# Patient Record
Sex: Female | Born: 1975 | Hispanic: Yes | Marital: Single | Smoking: Never smoker
Health system: Southern US, Community
[De-identification: ages and names within clinical notes are randomized; demographics above are authoritative.]

## PROBLEM LIST (undated history)

## (undated) DIAGNOSIS — A6000 Herpesviral infection of urogenital system, unspecified: Secondary | ICD-10-CM

## (undated) DIAGNOSIS — L309 Dermatitis, unspecified: Secondary | ICD-10-CM

## (undated) DIAGNOSIS — Z332 Encounter for elective termination of pregnancy: Secondary | ICD-10-CM

## (undated) DIAGNOSIS — E119 Type 2 diabetes mellitus without complications: Secondary | ICD-10-CM

## (undated) DIAGNOSIS — L80 Vitiligo: Secondary | ICD-10-CM

## (undated) DIAGNOSIS — M1990 Unspecified osteoarthritis, unspecified site: Secondary | ICD-10-CM

## (undated) DIAGNOSIS — K37 Unspecified appendicitis: Secondary | ICD-10-CM

## (undated) HISTORY — DX: Unspecified osteoarthritis, unspecified site: M19.90

## (undated) HISTORY — DX: Type 2 diabetes mellitus without complications: E11.9

## (undated) HISTORY — PX: APPENDECTOMY: SHX54

## (undated) HISTORY — DX: Vitiligo: L80

## (undated) HISTORY — DX: Dermatitis, unspecified: L30.9

## (undated) HISTORY — DX: Herpesviral infection of urogenital system, unspecified: A60.00

## (undated) HISTORY — DX: Unspecified appendicitis: K37

## (undated) HISTORY — PX: CHOLECYSTECTOMY: SHX55

---

## 2000-03-04 DIAGNOSIS — K37 Unspecified appendicitis: Secondary | ICD-10-CM

## 2000-03-04 HISTORY — DX: Unspecified appendicitis: K37

## 2000-12-14 ENCOUNTER — Encounter (INDEPENDENT_AMBULATORY_CARE_PROVIDER_SITE_OTHER): Payer: Self-pay | Admitting: *Deleted

## 2000-12-15 ENCOUNTER — Inpatient Hospital Stay (HOSPITAL_COMMUNITY): Admission: EM | Admit: 2000-12-15 | Discharge: 2000-12-23 | Payer: Self-pay | Admitting: Emergency Medicine

## 2000-12-15 ENCOUNTER — Encounter: Payer: Self-pay | Admitting: Emergency Medicine

## 2000-12-29 ENCOUNTER — Ambulatory Visit (HOSPITAL_COMMUNITY): Admission: RE | Admit: 2000-12-29 | Discharge: 2000-12-29 | Payer: Self-pay | Admitting: *Deleted

## 2001-01-08 ENCOUNTER — Ambulatory Visit (HOSPITAL_COMMUNITY): Admission: RE | Admit: 2001-01-08 | Discharge: 2001-01-08 | Payer: Self-pay | Admitting: General Surgery

## 2001-01-08 ENCOUNTER — Encounter: Payer: Self-pay | Admitting: General Surgery

## 2006-09-09 ENCOUNTER — Other Ambulatory Visit: Payer: Self-pay

## 2006-09-09 ENCOUNTER — Emergency Department: Payer: Self-pay | Admitting: Emergency Medicine

## 2008-01-08 IMAGING — US ABDOMEN ULTRASOUND
1 series · 17 of 25 positions shown · non-contrast
Comparison: none

REASON FOR EXAM: RUQ pain
COMMENTS:

PROCEDURE:     US  - US ABDOMEN GENERAL SURVEY  - September 09, 2006  [DATE]
RESULT:     Comparison: No available comparison exam.

[Series 1: abdomen ultrasound · 17 of 48 slices shown]
[im 1/48]
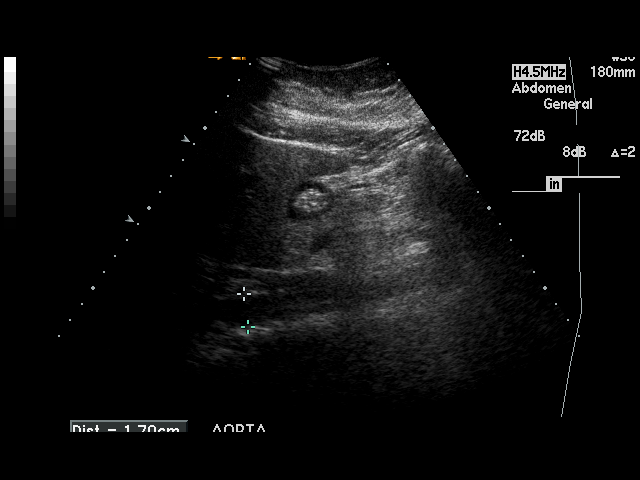
[im 4/48]
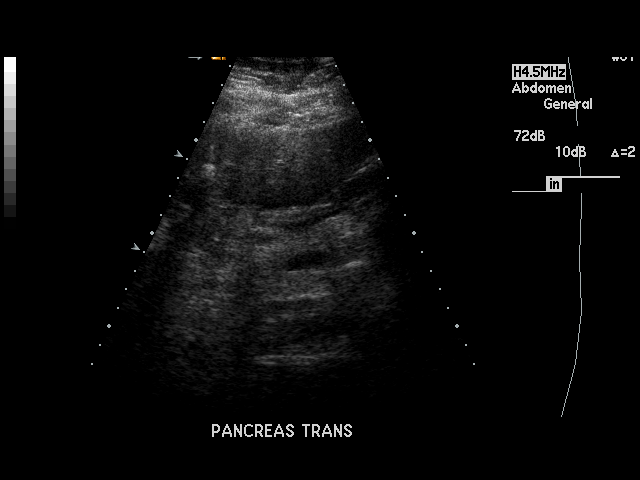
[im 6/48]
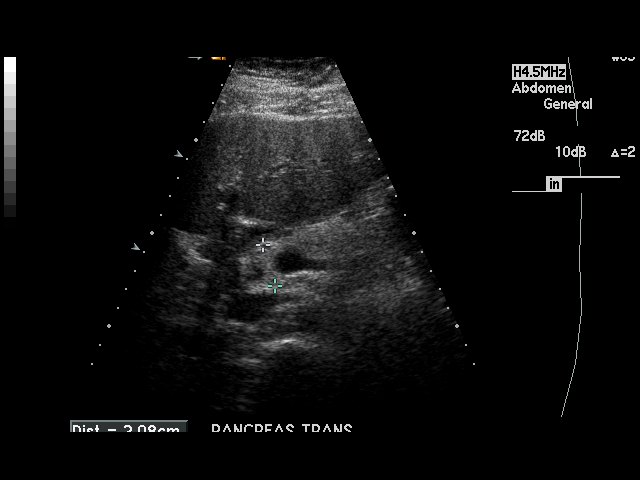
[im 10/48]
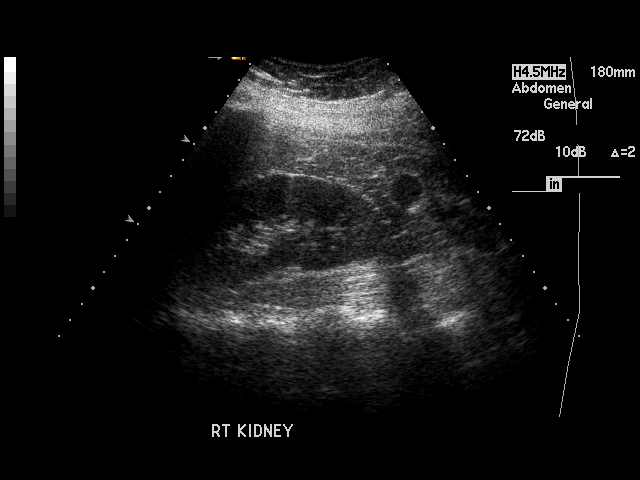
[im 12/48]
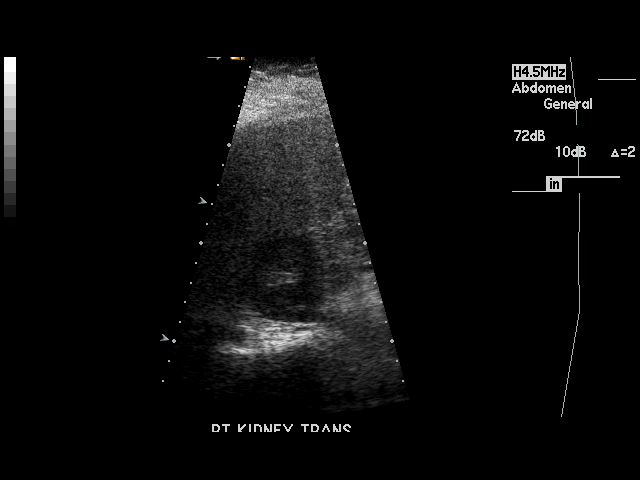
[im 16/48]
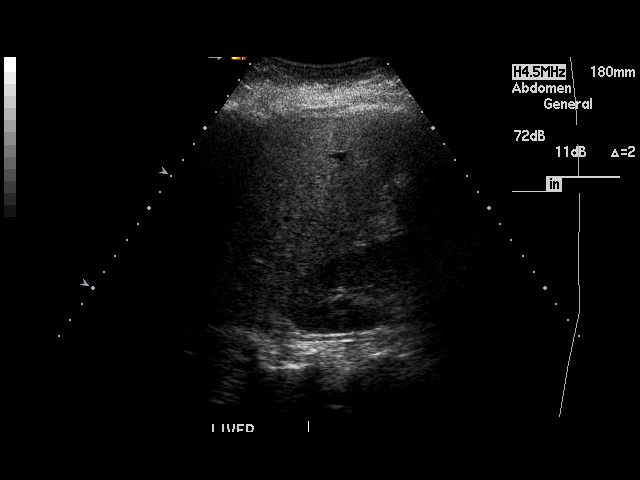
[im 18/48]
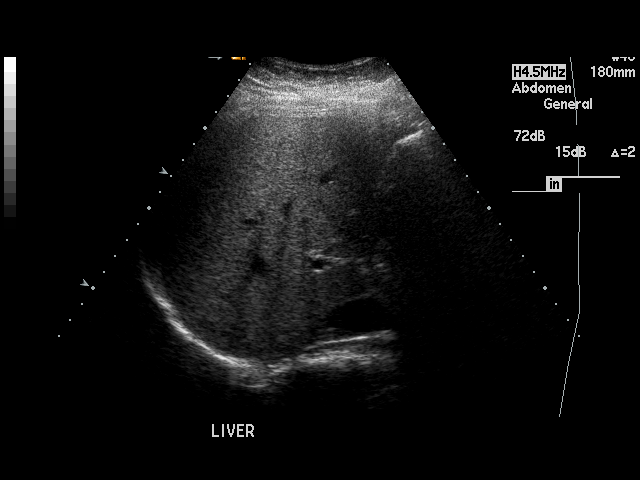
[im 22/48]
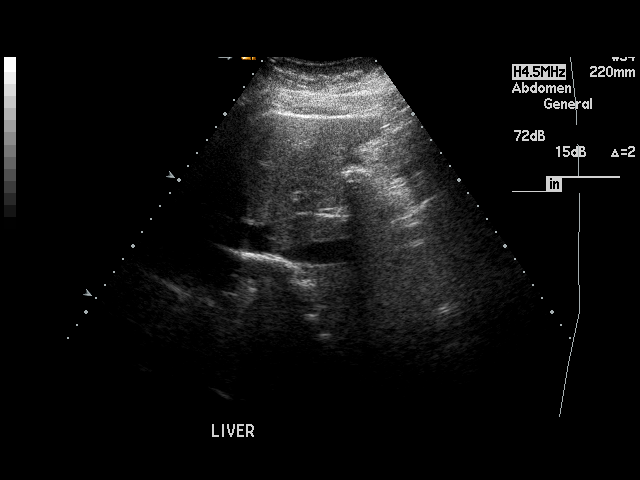
[im 24/48]
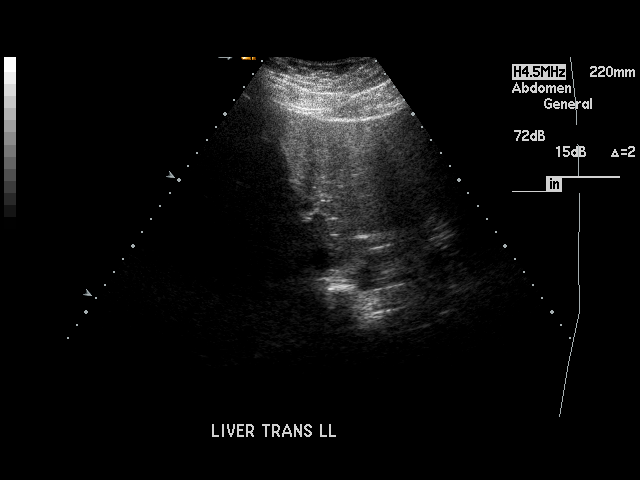
[im 26/48]
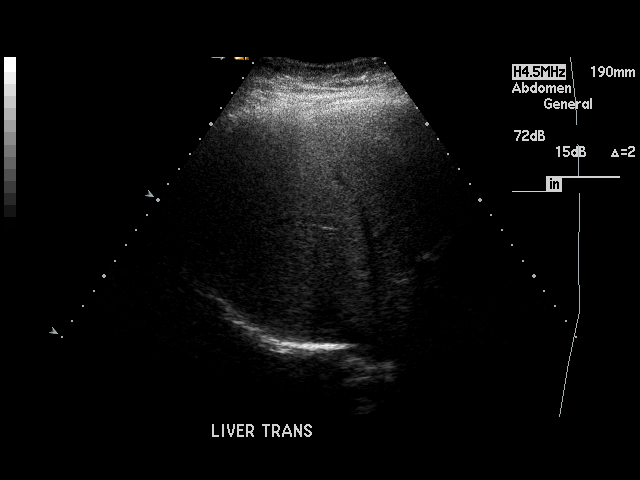
[im 30/48]
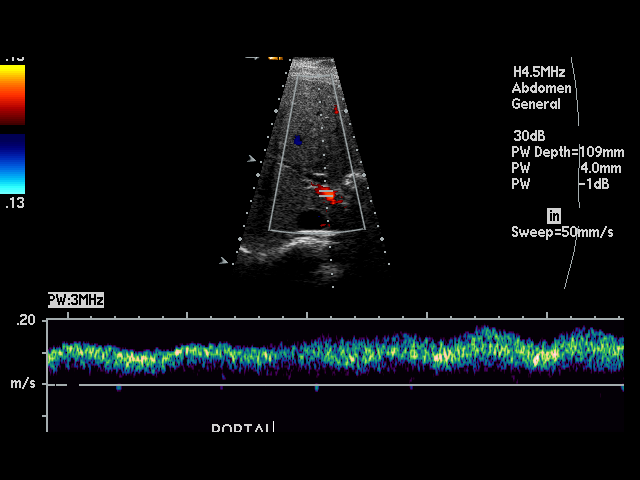
[im 32/48]
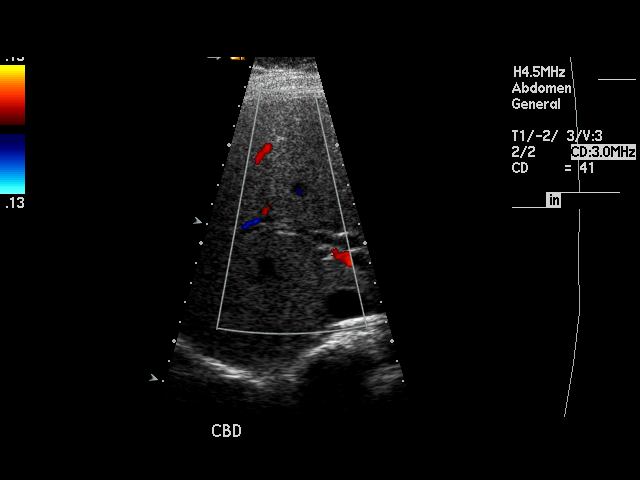
[im 36/48]
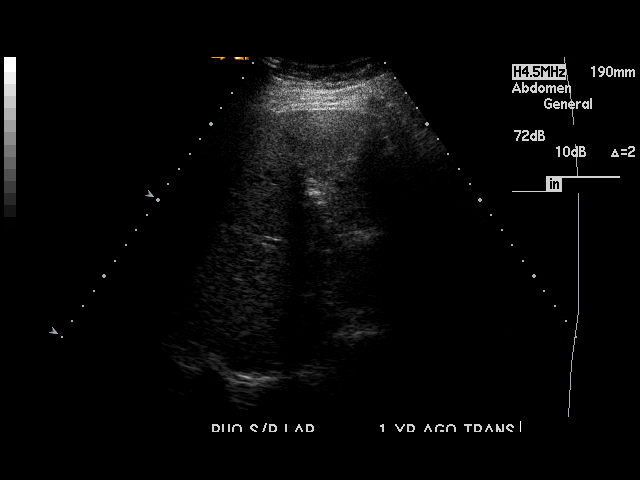
[im 38/48]
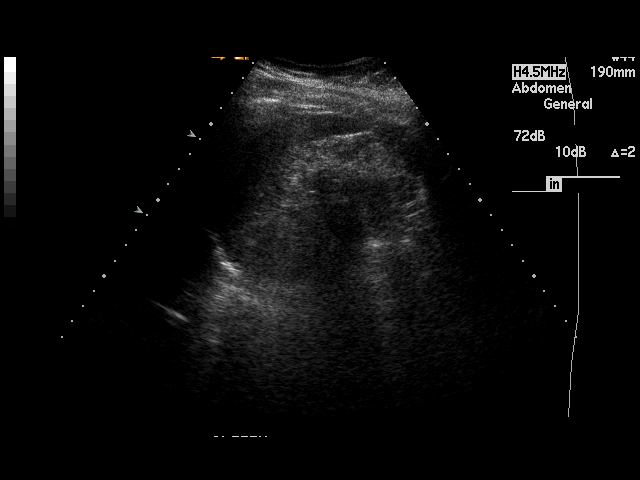
[im 42/48]
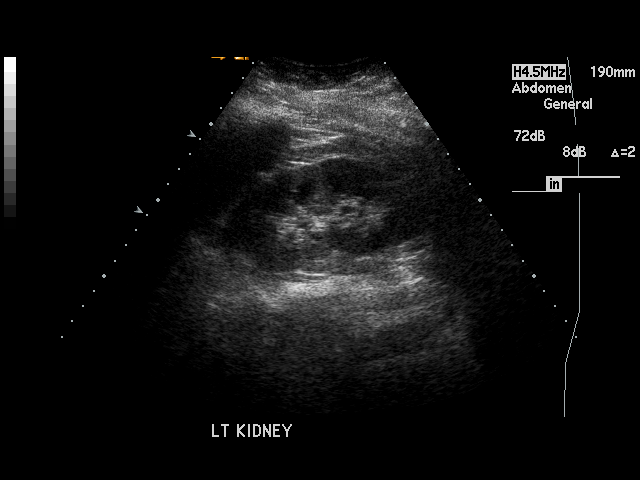
[im 44/48]
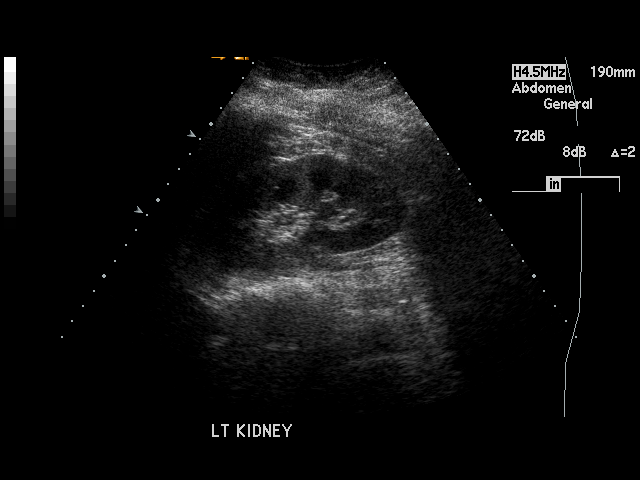
[im 48/48]
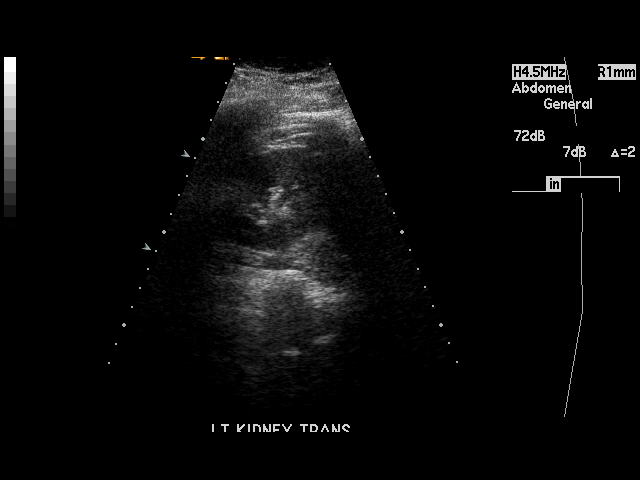

[17 of 25 positions shown; findings below may reference images not displayed]

FINDINGS: The liver is mildly diffusely echogenic. No focal mass is noted. There is no
intra hepatic bile duct dilatation. The common duct is mildly dilated. It
measures 8 mm in diameter. The gallbladder is absent. There is no
sonographic Murphy's sign. The main portal vein is patent with hepatopedal
flow.

The visualized pancreatic head and body are unremarkable. The right kidney
measures 10.9 cm and the left measures 11.7 cm in length. There is normal
corticomedullary differentiation. No definite renal stone or mass is noted.
There is no hydronephrosis. The spleen measures 10.7 cm in length. No
evidence of abdominal aortic aneurysm.
IMPRESSION: 1. Status post cholecystectomy. Mild dilatation of the common duct can be
seen status post cholecystectomy.

2. Mild diffusely increased echogenicity of the liver is most commonly due
to fatty infiltration.

## 2008-02-12 ENCOUNTER — Emergency Department (HOSPITAL_COMMUNITY): Admission: EM | Admit: 2008-02-12 | Discharge: 2008-02-12 | Payer: Self-pay | Admitting: Family Medicine

## 2010-07-20 NOTE — H&P (Signed)
[place]. [facility]  Patient:    Kristi Martinez, Kristi Martinez Visit Number: [id number] MRN: [id number]          Service Type: MED Location: [phone] Attending Physician:  Arlis Porta Dictated by:   Adolph Pollack, M.D. Admit Date:  12/14/2000                           History and Physical  CHIEF COMPLAINT: Right lower quadrant pain, nausea and vomiting.  HISTORY OF PRESENT ILLNESS: This is a 35 year old female, who has had intermittent crampy right lower quadrant pain that began last week. On Saturday it got acutely worse and persisted. She subsequently presented to an Urgent Care in [place], at which time she was noted to have the right lower quadrant abdominal pain.  There is some concern that she may have appendicitis. At Urgent Care she was noted to have a white blood cell count of 27,000. She subsequently was sent from Urgent Care to the emergency department here for evaluation. While she was in the emergency department here, she was sent for a CT scan which demonstrated right lower quadrant inflammatory process, which was what appeared to be a possible appendicolith. The findings were concerning for acute appendicitis with perforation.  It was at that time I was asked to see her. She states she has had some nausea and vomiting, and has had some diarrhea since she has been here in the emergency department. She has not had anything like this before. She is currently menstruating.  PAST MEDICAL HISTORY: No chronic illness or previous operations.  ALLERGIES: None.  MEDICATIONS: None.  SOCIAL HISTORY: No tobacco or alcohol use. She is single.  FAMILY HISTORY: Positive for diabetes in her mother.  REVIEW OF SYSTEMS: CARDIOVASCULAR: No known heart disease or hypertension. PULMONARY: No asthma or pneumonia. GASTROINTESTINAL: No peptic ulcer disease, hepatitis, or colitis. GENITOURINARY: No kidney stones. ENDOCRINE: No diabetes in herself.  NEUROLOGIC: No seizures. HEMATOLOGIC: No bleeding disorders or transfusions. No deep vein thrombosis.  PHYSICAL EXAMINATION:  GENERAL: Ill-appearing female who is obese.  VITAL SIGNS: Maximum temperature in the emergency department is 102 degrees. She is tachycardic.  HEENT: Extraocular motions intact, no icterus.  NECK: Supple without palpable masses.  CARDIOVASCULAR: Increased right.  RESPIRATORY: Breath sounds equal and clear. Respirations unlabored.  ABDOMEN: Obese, soft with right lower quadrant and suprapubic tenderness to palpation and percussion.  Hypoactive bowel sounds noted. No palpable masses noted.  MUSCULOSKELETAL: No cyanosis or edema at the extremities.  LABORATORY DATA: Remarkable for urine pregnancy test that is negative. Urinalysis demonstrates 3-6 white blood cells and 0 to 2 red blood cells. Her potassium is low at 3.3 with a sodium if 132 and albumin of 2.9. White blood cell count is 2900 with a leftward shift, hemoglobin 13.9 and platelet count 503,000.  Review of the CT scan demonstrates the right lower quadrant inflammatory process that is suspicious for acute appendicitis.  IMPRESSION: Right lower quadrant inflammatory process, most likely acute appendicitis with perforation.  Other differential diagnosis could include Crohns disease or Meckel diverticulitis.  PLAN: A laparoscopy with possible laparotomy and appendectomy.  Possible segmental bowel resection. I did explain the procedure, rationale and risk to her and her mother and father. The risks include but are not limited to bleeding, infection, or recurrent right lower quadrant abscess, accidental damage incremental organs, and the risk of anesthesia. She did understand and agreed to proceed. Dictated by:  Adolph Pollack, M.D. Attending Physician:  Arlis Porta DD:  12/15/00 TD:  12/15/00 Job: [id number]

## 2010-07-20 NOTE — Op Note (Signed)
[place]. [facility]  Patient:    DANAYSHA, KIRN Visit Number: [id number] MRN: [id number]          Service Type: EMS Location: [place] [place] Attending Physician:  Doug Sou Dictated by:   Adolph Pollack, M.D. Admit Date:  12/14/2000                             Operative Report  PREOPERATIVE DIAGNOSIS:  Perforated appendicitis with intra-abdominal abscess.  POSTOPERATIVE DIAGNOSIS:  Perforated appendicitis with intra-abdominal abscess.  PROCEDURES: Exploratory laparotomy with drainage of abdominal abscess, resection of distal ileum and right colon.  SURGEON:  Adolph Pollack, M.D.  ANESTHESIA:  General.  INDICATION:  This is a 35 year old female brought to the operating room for attempted diagnostic laparoscopy and appendectomy going into a laparoscopic approach for perforated appendicitis, finally introducing a laparoscope, a large inflammation mass with adhesion to the anterior wall and insufflation was unsuccessful.  She was also morbidly obese.  Thus the laparoscopy had to be terminated and she was converted to an exploratory laparotomy.  DESCRIPTION OF PROCEDURE:  The laparoscope was removed.  A lower midline incision was made, incising skin, subcutaneous tissue, fascia, and peritoneum. On entering the abdominal cavity, a large inflammatory mass involving omentum in the right lower quadrant was noted.  The omentum was peeled off the mass and purulent material evacuated by way of suction.  We then noted a very firm mass around the cecal area.  I mobilized the cecum bluntly and with the cautery, incising the lateral peritoneal attachments.  I then completely mobilized the right colon and the hepatic flexure.  The distal ileum was involved with the process and was nearly causing a near-complete obstruction because of its intense adherence to the inflammatory mass.  I could not specifically identify an appendix.  There was very intense  inflammation around the entire distal ileum, the cecum, and the proximal ascending colon.  I subsequently divided the distal ileum approximately 10 cm proximal to the ileocecal valve, and I divided the ascending colon approximately halfway up. This was performed with a linear cutting stapler.   I then divided the mesentery between clamps and ligated the mesenteric vessels.  The specimen was handed off the field.  I could not definitively identify an appendix.  After I had done this, I copiously irrigated out the right gutter and right lower quadrant and right pelvic area.  There was fairly intense inflammation in the right ovary and the tube.  Once I had irrigated this area out copiously, I mobilized the omentum off the proximal transverse colon.  I then created a side-to-side stapled anastomosis between the distal ileum and transverse colon.  The remaining enterotomy was closed with the linear noncutting stapler.  The anastomosis was patent, viable, and under no tension.  I subsequently again irrigated the right gutter and right lower quadrant area copiously with normal saline solution and placed the abdominal viscera back in the abdominal cavity and the omentum over them.  I then closed the fascia with a running #1 PDS suture on a large needle.  Because of the intense inflammatory reaction and infection and because there was at least 15 cm between the fascia and her skin, I chose not to close the skin and leave such a large dead space.  I packed the subcutaneous tissue with saline-soaked gauze and covered it with a dry dressing.  She tolerated the procedure well without any apparent complications  and was taken to the recovery room in satisfactory condition. Dictated by:   Adolph Pollack, M.D. Attending Physician:  Doug Sou DD:  12/15/00 TD:  12/15/00 Job: [id number]

## 2010-07-20 NOTE — Op Note (Signed)
[place]. [facility]  Patient:    Kristi Martinez, Kristi Martinez Visit Number: [id number] MRN: [id number]          Service Type: EMS Location: [place] [place] Attending Physician:  Doug Sou Dictated by:   Adolph Pollack, M.D. Proc. Date: 12/15/00 Admit Date:  12/14/2000                             Operative Report  PREOPERATIVE DIAGNOSIS:   Perforated appendicitis with intra-abdominal abscess.  POSTOPERATIVE DIAGNOSIS:  Perforated appendicitis with intra-abdominal abscess (pending pathology).  OPERATION: Diagnostic laparoscopy.  SURGEON:  Adolph Pollack, M.D.  ANESTHESIA: General.  INDICATIONS:  This is a 35 year old female who last week began having cramping right lower quadrant pain.  The pain worsened on Saturday.  She began having fever, nausea and vomiting.  She was seen in the emergency department by the emergency department physician and noted to have elevation of white blood cell count.  A CT was performed which demonstrated an inflammatory mass in the right lower quadrant with what appeared to be an appendicolith consistent with perforated appendicitis.  She is now brought to the operating room.  DESCRIPTION OF PROCEDURE: She was placed supine on the operating table and a general anesthetic was administered.  The abdomen was sterilely prepped and draped.  The Foley catheter was placed in the bladder.  A small subumbilical incision was made incising the skin sharply.  She is morbidly obese, and she had somewhat of a panniculus hanging over her umbilicus.  The subcutaneous tissue was divided.  The midline fascia was identified, and a 1.5 cm incision was made in the midline fascia.  The peritoneal cavity was entered bluntly under direct vision with purulent fluid evacuated.  A pursestring suture of 0 Vicryl was placed around the fascial edges.  The Hasson trocar was introduced into the peritoneal cavity, and CO2 gas was insufflated into the  peritoneal cavity.  The laparoscope was introduced.  I could not adequately insufflate the peritoneal cavity because of a pinstripe inflammatory mass which was adherent to the anterior abdominal wall.  Thus I had to abort the procedure and proceed with exploratory laparotomy which will be dictated in a separate note. Dictated by:   Adolph Pollack, M.D. Attending Physician:  Doug Sou DD:  12/15/00 TD:  12/15/00 Job: [id number]

## 2010-07-20 NOTE — Discharge Summary (Signed)
[place]. [facility]  Patient:    Kristi Martinez, UNREIN Visit Number: [id number] MRN: [id number]          Service Type: MED Location: [phone] Attending Physician:  Arlis Porta Dictated by:   Adolph Pollack, M.D. Admit Date:  12/14/2000 Discharge Date: 12/23/2000                             Discharge Summary  DISCHARGE DIAGNOSES: 1. Perforated appendicitis with intra-abdominal abscess. 2. Postoperative ileus.  PROCEDURE:  Exploratory laparotomy with drainage of intra-abdominal abscess, resection of distal ileum and right colon on December 15, 2000.  REASON FOR ADMISSION:  The patient is a 35 year old female who presented to the hospital with intermittent crampy right lower quadrant pain that continued to worsen.  She is noted to have a leukocytosis at the Urgent Care Center and was sent to the emergency department for evaluation.  At that time, a CT scan was performed which demonstrated right lower quadrant inflammatory process.  I was asked to see her at that time and admitted her.  She was taken to the operating room.  HOSPITAL COURSE:  She underwent the above operation.  Postoperatively, her wound was left open.  She was maintained on IV antibiotics.  She was a little hypovolemic and she was getting volume resuscitation.  She did get some oversedation secondary to the morphine PCA and this was discontinued.  She was getting intermittent morphine injections.  Her hypovolemia improved and she did have some acute blood loss anemia.  She was placed on appropriate protocol and this improved slowly.  She began normal saline wet-to-dry dressing changes.  Her ileus was resolving and her NG tube was removed.  She started slowly on her diet and eventually had her IV antibiotics discontinued.  On postop day #8, she was tolerating her diet.  She had refused some of her IV antibiotics at the time.  She subsequently was discharged by Dr. Daphine Deutscher  and placed on Vicodin.  Home health nurses were activated.  DISPOSITION:  Discharged to home in satisfactory condition on December 23, 2000.  DISCHARGE MEDICATIONS:  Vicodin for pain.  SPECIAL INSTRUCTIONS:  Home health nurses will come out and help her change her dressings.  She will come back and see me in the office in one week.  CONDITION ON DISCHARGE:  Satisfactory. Dictated by:   Adolph Pollack, M.D. Attending Physician:  Arlis Porta DD:  02/03/01 TD:  02/04/01 Job: [id number]

## 2011-08-24 ENCOUNTER — Emergency Department: Payer: Self-pay | Admitting: Emergency Medicine

## 2011-08-24 LAB — URINALYSIS, COMPLETE
Leukocyte Esterase: NEGATIVE
Nitrite: NEGATIVE
RBC,UR: 262 /HPF (ref 0–5)
Specific Gravity: 1.018 (ref 1.003–1.030)
Squamous Epithelial: 1

## 2011-08-24 LAB — CBC
HCT: 35 % (ref 35.0–47.0)
HGB: 11.8 g/dL — ABNORMAL LOW (ref 12.0–16.0)
MCH: 29.9 pg (ref 26.0–34.0)
MCHC: 33.7 g/dL (ref 32.0–36.0)
Platelet: 450 10*3/uL — ABNORMAL HIGH (ref 150–440)
RBC: 3.96 10*6/uL (ref 3.80–5.20)
WBC: 15.7 10*3/uL — ABNORMAL HIGH (ref 3.6–11.0)

## 2011-08-24 LAB — HCG, QUANTITATIVE, PREGNANCY: Beta Hcg, Quant.: 59040 m[IU]/mL — ABNORMAL HIGH

## 2011-08-25 ENCOUNTER — Encounter (HOSPITAL_COMMUNITY): Payer: Self-pay | Admitting: Emergency Medicine

## 2011-08-25 ENCOUNTER — Emergency Department (HOSPITAL_COMMUNITY)
Admission: EM | Admit: 2011-08-25 | Discharge: 2011-08-25 | Disposition: A | Discharge status: Home / Self Care | Payer: BC Managed Care – PPO | Attending: Emergency Medicine | Admitting: Emergency Medicine

## 2011-08-25 DIAGNOSIS — O209 Hemorrhage in early pregnancy, unspecified: Secondary | ICD-10-CM | POA: Insufficient documentation

## 2011-08-25 DIAGNOSIS — O200 Threatened abortion: Secondary | ICD-10-CM

## 2011-08-25 DIAGNOSIS — Z9089 Acquired absence of other organs: Secondary | ICD-10-CM | POA: Insufficient documentation

## 2011-08-25 HISTORY — DX: Encounter for elective termination of pregnancy: Z33.2

## 2011-08-25 LAB — RH IG WORKUP (INCLUDES ABO/RH)
ABO/RH(D): O POS
Gestational Age(Wks): 11

## 2011-08-25 NOTE — Discharge Instructions (Signed)
As discussed, it is very important that you follow up with her obstetrician as soon as possible.  Please call tomorrow to insure appropriate ongoing care.  If you develop any new, or concerning changes in your condition, please return to the emergency room immediately.

## 2011-08-25 NOTE — ED Notes (Signed)
PT. REPORTS VAGINAL BLEEDING THIS EVENING , STATES [redacted] WEEKS PREGNANT G3P1 , DENIES ABDOMINAL CRAMPING .

## 2011-08-25 NOTE — ED Provider Notes (Signed)
History     CSN: [id number]  Arrival date & time 08/25/11  0019   First MD Initiated Contact with Patient 08/25/11 0044      Chief Complaint  Patient presents with  . Vaginal Bleeding    (Consider location/radiation/quality/duration/timing/severity/associated sxs/prior treatment) HPI  Past Medical History  Diagnosis Date  . Abortion in first trimester     Past Surgical History  Procedure Date  . Cholecystectomy   . Appendectomy   . Cesarean section     No family history on file.  History  Substance Use Topics  . Smoking status: Never Smoker   . Smokeless tobacco: Not on file  . Alcohol Use: No    OB History    Grav Para Term Preterm Abortions TAB SAB Ect Mult Living   1               Review of Systems  All other systems reviewed and are negative.    Allergies  Review of patient's allergies indicates no known allergies.  Home Medications   Current Outpatient Rx  Name Route Sig Dispense Refill  . CETIRIZINE HCL 10 MG PO TABS Oral Take 10 mg by mouth daily.    Marland Kitchen FOLIC ACID PO Oral Take 1 tablet by mouth daily. OTC    . PRENATAL MULTIVITAMIN CH Oral Take 1 tablet by mouth daily.      BP 128/75  Pulse 116  Temp 98.5 F (36.9 C) (Oral)  Resp 18  SpO2 98%  Physical Exam  Nursing note and vitals reviewed. Constitutional: She is oriented to person, place, and time. She appears well-developed and well-nourished.  HENT:  Head: Normocephalic and atraumatic.  Eyes: Conjunctivae and EOM are normal.  Cardiovascular: Regular rhythm.  Tachycardia present.   Pulmonary/Chest: Effort normal and breath sounds normal. No stridor.  Abdominal:       Gravid abd, non-tender  Genitourinary: Pelvic exam was performed with patient supine. There is no tenderness on the right labia. There is no tenderness on the left labia. Uterus is not tender. Cervix exhibits no motion tenderness. Right adnexum displays no tenderness. Left adnexum displays no tenderness. There is  bleeding around the vagina.       Cervical os not open.  +mild trace bleeding.  Musculoskeletal: She exhibits no edema.  Neurological: She is alert and oriented to person, place, and time. No cranial nerve deficit.  Skin: Skin is warm and dry.       vitiglio  Psychiatric: She has a normal mood and affect.    ED Course  [place] bedside Performed by: Gerhard Munch Authorized by: Gerhard Munch  [place] bedside Date/Time: 08/25/2011 1:30 AM Performed by: Gerhard Munch Authorized by: Gerhard Munch Consent: Verbal consent obtained. Written consent not obtained. The procedure was performed in an emergent situation. Time out: Immediately prior to procedure a "time out" was called to verify the correct patient, procedure, equipment, support staff and site/side marked as required. Local anesthesia used: no Patient sedated: no Patient tolerance: Patient tolerated the procedure well with no immediate complications. Comments: FHT ~160-180. IUP visualized with difficulty. +good fetal motion.    (including critical care time)   Labs Reviewed  RH IG WORKUP (INCLUDES ABO/RH)   No results found.   No diagnosis found.    MDM  This G3 P1 at approximately 11 weeks estimated gestational age presents with new vaginal bleeding.  Notably, the patient has minimal pain or any other complaints.  On exam the patient is anxious, but in  no distress.  A bedside ultrasound demonstrated the intrauterine pregnancy with fetal heart tones in the 160/180 range with appropriate fetal motion.  Patient had no open os.  The patient's blood type was positive, and after a discussion on the necessity of following up with her obstetrician as soon as possible to insure appropriate management of this episode of bleeding in early pregnancy with threatened miscarriage the patient was discharged in stable condition.  Gerhard Munch, MD 08/25/11 [phone]

## 2011-10-03 ENCOUNTER — Ambulatory Visit: Payer: Self-pay | Admitting: Obstetrics and Gynecology

## 2011-11-03 ENCOUNTER — Ambulatory Visit: Payer: Self-pay | Admitting: Obstetrics and Gynecology

## 2012-03-09 ENCOUNTER — Ambulatory Visit: Payer: Self-pay | Admitting: Obstetrics and Gynecology

## 2012-03-09 LAB — CBC WITH DIFFERENTIAL/PLATELET
Basophil #: 0.1 10*3/uL (ref 0.0–0.1)
Basophil %: 0.7 %
Eosinophil %: 1 %
MCH: 30.2 pg (ref 26.0–34.0)
MCHC: 34.3 g/dL (ref 32.0–36.0)
MCV: 88 fL (ref 80–100)
Monocyte #: 0.3 x10 3/mm (ref 0.2–0.9)
Monocyte %: 4.1 %
Neutrophil #: 5.4 10*3/uL (ref 1.4–6.5)
RBC: 3.79 10*6/uL — ABNORMAL LOW (ref 3.80–5.20)

## 2012-03-10 ENCOUNTER — Inpatient Hospital Stay: Payer: Self-pay | Admitting: Obstetrics and Gynecology

## 2014-01-03 ENCOUNTER — Encounter (HOSPITAL_COMMUNITY): Payer: Self-pay | Admitting: Emergency Medicine

## 2014-06-24 NOTE — Op Note (Signed)
PATIENT NAME:  Kristi Martinez, Iyonna R MR#:  [id number] DATE OF BIRTH:  [birth year]  DATE OF PROCEDURE:  03/10/2012  POSTOPERATIVE DIAGNOSES: Term intrauterine pregnancy, prior history of cesarean section.   POSTOPERATIVE DIAGNOSIS: Term intrauterine pregnancy, prior history of cesarean section.   PROCEDURE PERFORMED:  Low transverse cesarean section.   SURGEON:  Annamarie MajorPaul Racine Erby, M.D.   ASSISTANT:  Vena AustriaAndreas Staebler, M.D.   ANESTHESIA:  Spinal.   ESTIMATED BLOOD LOSS:  250 mL.   COMPLICATIONS:  None.   FINDINGS: Significant adhesions with the uterus. There was no apparent bowel, bladder or ureter involvement or injury. Viable female infant weighing 7 pounds, 13 ounces with Apgar scores of 8 and 9 at 1 and 5 minutes, respectively, was delivered.   DISPOSITION:  To the recovery room in stable condition.   TECHNIQUE: The patient was prepped and draped in the usual sterile fashion after adequate anesthesia was obtained in the supine position on the operating room table. A scalpel was used to create a midline vertical skin incision in the area of the prior scar. Dissection is carried down to the level of the rectus fascia, which was then dissected and carefully incised using Mayo scissors in a superior and inferior direction. The midline was identified and the rectus muscles were separated. The peritoneum was penetrated and a bladder blade was inserted with inferior retraction of the bladder.   A scalpel was used to create a low transverse hysterotomy incision that was then extended by blunt dissection. Amniotomy reveals green-tinged fluid. The infant's head is grasped and delivered without the use of a vacuum device without complication.  No nuchal cord is encountered. The remaining portion of the infant is delivered with clamping and cutting of the umbilical cord. The infant was noted to have a vigorous cry immediately. Infant handed to pediatric team.   Cord blood was obtained. The placenta is manually  extracted. The uterus is cleansed of all membranes and debris using a moist sponge. It is unable to be externalized. The hysterotomy incision is closed with a running #1 Vicryl suture in a locking fashion with additional suture placed to obtain excellent hemostasis. The pelvic cavity is irrigated with warm saline with aspiration of all fluid, and excellent hemostasis is noted.   The rectus fascia is closed with a 0 Maxon suture. Subcutaneous tissues are irrigated and hemostasis is assured using electrocautery, and the subcutaneous layer is closed with a plain gut suture. Skin is then closed with dissolvable staples and reinforced with Steri-Strips. A sterile bandage is applied. The patient goes to the recovery room in stable condition. All sponge, instrument and needle counts are correct.     ____________________________ R. Annamarie MajorPaul Talaya Lamprecht, MD rph:dm D: 03/10/2012 09:09:57 ET T: 03/10/2012 13:14:57 ET JOB#: [id number]  cc: Dierdre Searles. Paul Meaghan Whistler, MD, <Dictator> Nadara MustardOBERT P Leliana Kontz MD ELECTRONICALLY SIGNED 03/27/2012 8:00

## 2016-05-29 ENCOUNTER — Ambulatory Visit: Payer: Medicaid Other | Admitting: Family

## 2017-11-18 ENCOUNTER — Ambulatory Visit (INDEPENDENT_AMBULATORY_CARE_PROVIDER_SITE_OTHER): Payer: BLUE CROSS/BLUE SHIELD | Admitting: Obstetrics and Gynecology

## 2017-11-18 ENCOUNTER — Encounter: Payer: Self-pay | Admitting: Obstetrics and Gynecology

## 2017-11-18 ENCOUNTER — Other Ambulatory Visit (INDEPENDENT_AMBULATORY_CARE_PROVIDER_SITE_OTHER): Payer: BLUE CROSS/BLUE SHIELD

## 2017-11-18 ENCOUNTER — Other Ambulatory Visit (HOSPITAL_COMMUNITY)
Admission: RE | Admit: 2017-11-18 | Discharge: 2017-11-18 | Disposition: A | Discharge status: Home / Self Care | Payer: BLUE CROSS/BLUE SHIELD | Source: Ambulatory Visit | Attending: Obstetrics and Gynecology | Admitting: Obstetrics and Gynecology

## 2017-11-18 ENCOUNTER — Other Ambulatory Visit: Payer: Self-pay | Admitting: Obstetrics and Gynecology

## 2017-11-18 VITALS — BP 104/70 | HR 77 | Ht 60.0 in | Wt 210.0 lb

## 2017-11-18 DIAGNOSIS — Z124 Encounter for screening for malignant neoplasm of cervix: Secondary | ICD-10-CM

## 2017-11-18 DIAGNOSIS — R102 Pelvic and perineal pain: Secondary | ICD-10-CM

## 2017-11-18 DIAGNOSIS — Z1151 Encounter for screening for human papillomavirus (HPV): Secondary | ICD-10-CM | POA: Insufficient documentation

## 2017-11-18 DIAGNOSIS — Z30431 Encounter for routine checking of intrauterine contraceptive device: Secondary | ICD-10-CM

## 2017-11-18 DIAGNOSIS — R1031 Right lower quadrant pain: Secondary | ICD-10-CM

## 2017-11-18 DIAGNOSIS — T8332XA Displacement of intrauterine contraceptive device, initial encounter: Secondary | ICD-10-CM

## 2017-11-18 NOTE — Progress Notes (Signed)
Patient, No Pcp Per   Chief Complaint  Patient presents with  . Contraception    Check IUD, has had "stabbing pain" on right pelvic side since May    HPI:      Ms. Kristi Martinez is a 42 y.o. G1P0 who LMP was No LMP recorded. (Menstrual status: IUD)., presents today for RLQ pain with menses since 5/19. Mirena placed 11/04/14. Pt has menses monthly, lasting a few days with light flow vs 10-12 days with heavy flow and clots. Pt usually without dysmen but has had sharp RLQ pain with the past 4 periods. NSAIDs minimally helpful, has used heating pad. Pain can be so bad she can barely walk. Went to urgent care last month and diagnosed with UTI. No vag sx, fevers, LBP. No pain if not with menses. Pt is not sex active.  Not current on pap.    Past Medical History:  Diagnosis Date  . Abortion in first trimester   . Appendicitis 2002  . Eczema   . Herpes genitalis   . Vitiligo     Past Surgical History:  Procedure Laterality Date  . APPENDECTOMY    . CESAREAN SECTION    . CHOLECYSTECTOMY      Family History  Problem Relation Age of Onset  . Diabetes Mellitus II Mother   . Hypertension Mother   . Hypertension Father   . Diabetes Sister        Gestational  . Hodgkin's lymphoma Maternal Grandmother   . Heart disease Paternal Grandmother        Had CABG  . Hypertension Paternal Grandmother     Social History   Socioeconomic History  . Marital status: Single    Spouse name: Not on file  . Number of children: Not on file  . Years of education: Not on file  . Highest education level: Not on file  Occupational History  . Not on file  Social Needs  . Financial resource strain: Not on file  . Food insecurity:    Worry: Not on file    Inability: Not on file  . Transportation needs:    Medical: Not on file    Non-medical: Not on file  Tobacco Use  . Smoking status: Never Smoker  . Smokeless tobacco: Never Used  Substance and Sexual Activity  . Alcohol use: No  .  Drug use: No  . Sexual activity: Not Currently    Birth control/protection: IUD    Comment: Mirena  Lifestyle  . Physical activity:    Days per week: Not on file    Minutes per session: Not on file  . Stress: Not on file  Relationships  . Social connections:    Talks on phone: Not on file    Gets together: Not on file    Attends religious service: Not on file    Active member of club or organization: Not on file    Attends meetings of clubs or organizations: Not on file    Relationship status: Not on file  . Intimate partner violence:    Fear of current or ex partner: Not on file    Emotionally abused: Not on file    Physically abused: Not on file    Forced sexual activity: Not on file  Other Topics Concern  . Not on file  Social History Narrative  . Not on file    Outpatient Medications Prior to Visit  Medication Sig Dispense Refill  . cetirizine (ZYRTEC) 10  MG tablet Take 10 mg by mouth daily.    Marland Kitchen levonorgestrel (MIRENA) 20 MCG/24HR IUD 1 each by Intrauterine route once.    Marland Kitchen FOLIC ACID PO Take 1 tablet by mouth daily. OTC    . Prenatal Vit-Fe Fumarate-FA (PRENATAL MULTIVITAMIN) TABS Take 1 tablet by mouth daily.     No facility-administered medications prior to visit.     ROS:  Review of Systems  Constitutional: Negative for fatigue, fever and unexpected weight change.  Respiratory: Negative for cough, shortness of breath and wheezing.   Cardiovascular: Negative for chest pain, palpitations and leg swelling.  Gastrointestinal: Positive for nausea. Negative for blood in stool, constipation, diarrhea and vomiting.  Endocrine: Negative for cold intolerance, heat intolerance and polyuria.  Genitourinary: Positive for pelvic pain. Negative for dyspareunia, dysuria, flank pain, frequency, genital sores, hematuria, menstrual problem, urgency, vaginal bleeding, vaginal discharge and vaginal pain.  Musculoskeletal: Negative for back pain, joint swelling and myalgias.  Skin:  Negative for rash.  Neurological: Negative for dizziness, syncope, light-headedness, numbness and headaches.  Hematological: Negative for adenopathy.  Psychiatric/Behavioral: Negative for agitation, confusion, sleep disturbance and suicidal ideas. The patient is not nervous/anxious.     OBJECTIVE:   Vitals:  BP 104/70   Pulse 77   Ht 5' (1.524 m)   Wt 210 lb (95.3 kg)   Breastfeeding? No   BMI 41.01 kg/m   Physical Exam  Constitutional: She is oriented to person, place, and time. Vital signs are normal. She appears well-developed.  Pulmonary/Chest: Effort normal.  Genitourinary: Uterus normal. There is no rash, tenderness or lesion on the right labia. There is no rash, tenderness or lesion on the left labia. Uterus is not enlarged and not tender. Cervix exhibits no motion tenderness. Right adnexum displays no mass and no tenderness. Left adnexum displays no mass and no tenderness. There is bleeding in the vagina. No erythema or tenderness in the vagina. No vaginal discharge found.  Genitourinary Comments: IUD STRINGS NOT SEEN OR PALPATED IN CX OS  Musculoskeletal: Normal range of motion.  Neurological: She is alert and oriented to person, place, and time.  Psychiatric: She has a normal mood and affect. Her behavior is normal. Thought content normal.  Vitals reviewed.   Assessment/Plan: RLQ abdominal pain - Check GYN u/s for IUD placement/ovar path. Will call with results.  Intrauterine contraceptive device threads lost, initial encounter  Encounter for routine checking of intrauterine contraceptive device (IUD)  Cervical cancer screening - Plan: Cytology - PAP  Screening for HPV (human papillomavirus) - Plan: Cytology - PAP     Return for GYN u/s for RLQ pain/IUD chck.  Alicia B. Copland, PA-C 11/20/2017 3:07 PM

## 2017-11-18 NOTE — Patient Instructions (Signed)
I value your feedback and entrusting us with your care. If you get a [facility] patient survey, I would appreciate you taking the time to let us know about your experience today. Thank you! 

## 2017-11-19 DIAGNOSIS — T8332XA Displacement of intrauterine contraceptive device, initial encounter: Secondary | ICD-10-CM | POA: Insufficient documentation

## 2017-11-20 ENCOUNTER — Encounter: Payer: Self-pay | Admitting: Obstetrics and Gynecology

## 2017-11-20 ENCOUNTER — Telehealth: Payer: Self-pay | Admitting: Obstetrics and Gynecology

## 2017-11-20 LAB — CYTOLOGY - PAP
Diagnosis: NEGATIVE
HPV: NOT DETECTED

## 2017-11-20 NOTE — Telephone Encounter (Signed)
LMTRC twice on cell. Work # not active. Trying to f/u with GYN u/s results. Needs IUD removed.

## 2017-11-24 MED ORDER — MISOPROSTOL 100 MCG PO TABS: 100.0000 ug | ORAL_TABLET | Freq: Once | ORAL | 0 refills | Status: DC

## 2017-11-24 NOTE — Telephone Encounter (Signed)
Pt aware of IUD in LUS and need for removal. Cause of pain with menses. She would like replacement. Strings not visible. Pt to RTO for IUD removal/replacement--she will call back to schedule appt.  Rx cytotec/NSAIDs 1 hr before appt.

## 2018-03-24 ENCOUNTER — Telehealth: Payer: Self-pay | Admitting: Obstetrics and Gynecology

## 2018-03-24 NOTE — Telephone Encounter (Signed)
Patient scheduled 1/29 for Mirena replacement with ABC

## 2018-03-27 NOTE — Telephone Encounter (Signed)
Mirena reserved for this patient. 

## 2018-04-01 ENCOUNTER — Encounter: Payer: Self-pay | Admitting: Obstetrics and Gynecology

## 2018-04-01 ENCOUNTER — Ambulatory Visit (INDEPENDENT_AMBULATORY_CARE_PROVIDER_SITE_OTHER): Payer: BLUE CROSS/BLUE SHIELD | Admitting: Obstetrics and Gynecology

## 2018-04-01 VITALS — BP 124/80 | HR 90 | Ht 60.0 in | Wt 211.0 lb

## 2018-04-01 DIAGNOSIS — Z30432 Encounter for removal of intrauterine contraceptive device: Secondary | ICD-10-CM

## 2018-04-01 DIAGNOSIS — T8332XD Displacement of intrauterine contraceptive device, subsequent encounter: Secondary | ICD-10-CM

## 2018-04-01 NOTE — Progress Notes (Signed)
   Chief Complaint  Patient presents with  . Contraception    Mirena removal/reinsertion     History of Present Illness:  Kristi Martinez is a 43 y.o. that had a Mirena IUD placed approximately 3 years ago. She has had irregular bleeding and pain for the past few months. IUD found to be in LUS on 9/19 u/s. Pt returns for removal/replacement. She is not sex active.   BP 124/80   Pulse 90   Ht 5' (1.524 m)   Wt 211 lb (95.7 kg)   BMI 41.21 kg/m   Pelvic exam:  Two IUD strings absent in cx os. EGBUS, vaginal vault and cervix: within normal limits  IUD Removal IUD removed with [place] forceps.  Pt tolerated this well.  IUD noted to be intact.   IUD Insertion Procedure Note Patient identified, informed consent performed, consent signed.   Discussed risks of irregular bleeding, cramping, infection, malpositioning or misplacement of the IUD outside the uterus which may require further procedure such as laparoscopy, risk of failure <1%. Time out was performed.    Speculum placed in the vagina.  Cervix visualized.  Cleaned with Betadine x 2.  Grasped anteriorly with a single tooth tenaculum.  Uterus sounded to 13+ cm.  Uterus noted to be 9.95 x 5.82 x 6.03 cm. On 9/19 u/s.   ASSESSMENT:  Encounter for removal of intrauterine contraceptive device (IUD) - IUD removed. Pt wants replacement. Given the discrepancy in uterine sounding vs u/s results, suggest IUD placement with u/s guidance.  Intrauterine contraceptive device threads lost, subsequent encounter  Dicussed with Dr. Jerene Pitch.   Plan:  Patient was given post-procedure instructions.    Call if you are having increasing pain, cramps or bleeding or if you have a fever greater than 100.4 degrees F., shaking chills, nausea or vomiting.   Return in about 1 day (around 04/02/2018) for u/s guided Mirena insertion with CS.  Preciliano Castell B. Gahel Safley, PA-C 04/01/2018 10:49 AM

## 2018-04-01 NOTE — Patient Instructions (Signed)
I value your feedback and entrusting us with your care. If you get a [facility] patient survey, I would appreciate you taking the time to let us know about your experience today. Thank you!  [facility] [phone]  Instructions after IUD insertion  Most women experience no significant problems after insertion of an IUD, however minor cramping and spotting for a few days is common. Cramps may be treated with ibuprofen 800mg every 8 hours or Tylenol 650 mg every 4 hours. Contact [facility] immediately if you experience any of the following symptoms during the next week: temperature >99.6 degrees, worsening pelvic pain, abdominal pain, fainting, unusually heavy vaginal bleeding, foul vaginal discharge, or if you think you have expelled the IUD.  Nothing inserted in the vagina for 48 hours. You will be scheduled for a follow up visit in approximately four weeks.  You should check monthly to be sure you can feel the IUD strings in the upper vagina. If you are having a monthly period, try to check after each period. If you cannot feel the IUD strings,  contact [facility] immediately so we can do an exam to determine if the IUD has been expelled.   Please use backup protection until we can confirm the IUD is in place.  Call [facility] if you are exposed to or diagnosed with a sexually transmitted infection, as we will need to discuss whether it is safe for you to continue using an IUD.   

## 2018-04-10 ENCOUNTER — Other Ambulatory Visit: Payer: Self-pay | Admitting: Obstetrics and Gynecology

## 2018-04-10 DIAGNOSIS — Z30430 Encounter for insertion of intrauterine contraceptive device: Secondary | ICD-10-CM

## 2018-04-15 ENCOUNTER — Other Ambulatory Visit: Payer: Self-pay | Admitting: Obstetrics and Gynecology

## 2018-04-15 ENCOUNTER — Encounter: Payer: Self-pay | Admitting: Obstetrics and Gynecology

## 2018-04-15 ENCOUNTER — Ambulatory Visit (INDEPENDENT_AMBULATORY_CARE_PROVIDER_SITE_OTHER): Payer: BLUE CROSS/BLUE SHIELD | Admitting: Obstetrics and Gynecology

## 2018-04-15 ENCOUNTER — Other Ambulatory Visit (HOSPITAL_COMMUNITY)
Admission: RE | Admit: 2018-04-15 | Discharge: 2018-04-15 | Disposition: A | Discharge status: Home / Self Care | Payer: BLUE CROSS/BLUE SHIELD | Source: Ambulatory Visit | Attending: Obstetrics and Gynecology | Admitting: Obstetrics and Gynecology

## 2018-04-15 ENCOUNTER — Ambulatory Visit (INDEPENDENT_AMBULATORY_CARE_PROVIDER_SITE_OTHER): Payer: BLUE CROSS/BLUE SHIELD

## 2018-04-15 VITALS — BP 122/80 | HR 82 | Ht 60.0 in | Wt 209.0 lb

## 2018-04-15 DIAGNOSIS — Z30430 Encounter for insertion of intrauterine contraceptive device: Secondary | ICD-10-CM

## 2018-04-15 DIAGNOSIS — N939 Abnormal uterine and vaginal bleeding, unspecified: Secondary | ICD-10-CM

## 2018-04-15 DIAGNOSIS — N8302 Follicular cyst of left ovary: Secondary | ICD-10-CM | POA: Diagnosis not present

## 2018-04-15 DIAGNOSIS — N898 Other specified noninflammatory disorders of vagina: Secondary | ICD-10-CM

## 2018-04-15 DIAGNOSIS — R102 Pelvic and perineal pain: Secondary | ICD-10-CM

## 2018-04-15 MED ORDER — LEVONORGESTREL 20 MCG/24HR IU IUD: INTRAUTERINE_SYSTEM | Freq: Once | INTRAUTERINE | Status: DC

## 2018-04-15 MED ORDER — METRONIDAZOLE 500 MG PO TABS: 500.0000 mg | ORAL_TABLET | Freq: Two times a day (BID) | ORAL | 0 refills | Status: AC

## 2018-04-15 NOTE — Progress Notes (Signed)
IUD PROCEDURE NOTE:  Kristi Martinez is a 43 y.o. [phone] here for IUD insertion.  She is presenting today for IUD insertion under ultrasound.  She has had 9 months of pain in her sides.  She reports that the pain occurs several times a month sometimes right before her.  She generally feels the pain in her side.  The pain is severe and will last throughout an entire 24 to 48 hours.  The pain should stop her from most activity but she continues.  She takes Motrin and Tylenol for the pain with limited relief.  She is frustrated and feels like she has seen many people for this problem.  She was initially told that the IUD was the reason for the pain because it was malpositioned.  However the IUD has been out for several weeks and she did experience the pain again last week.  She reports that she has been told it may be her appendix but her appendix was taken out.  She has also been told that it may be a kidney stone but she has not had any further imaging to verify this.  She has not had frank hematuria.  She has not had any changes in her bowel movements.  She does report that she has had some nausea but mostly when she is experiencing the severe pain.  She has not been having heavy or regular bleeding with the IUD in place however she reports that after the IUD was removed she had 2 weeks of heavy bleeding which reminded her of the reason she initially had the IUD placed 4 years ago for dysfunctional uterine bleeding.  She would like to have the IUD replaced today under ultrasound guidance.  After discussion of her pain and symptoms of irregular bleeding she elected to have an endometrial biopsy today before insertion of the IUD.  Her ovaries were normal on today's ultrasound with no abnormal cyst or masses.  Her uterus is enlarged and bulky with no discrete fibroids seen.  We discussed that her symptoms could be related to adenomyosis however this is difficult to diagnose clinically.  She does not tend to have  pain when she is on her menstrual menses. Her last pap smear was normal.  She is particularly tearful today because her mother died when she was young.  She says that her mother died at 61 from a diabetic leg infection and sepsis that resulted.  She is emotional because her mother battled her leg infection for 9 months before she died and the patient feels like this is a similar experience.  She says people always told her that she is like her mother.  She has a 27-year-old daughter and she does not want to die and leave her children.  She would like to make sure all of her gynecological concerns are normal before seeking care with an internal medicine doctor for the pain.  Endometrial Biopsy After discussion with the patient regarding her abnormal uterine bleeding I recommended that she proceed with an endometrial biopsy for further diagnosis. The risks, benefits, alternatives, and indications for an endometrial biopsy were discussed with the patient in detail. She understood the risks including infection, bleeding, cervical laceration and uterine perforation.  Verbal consent was obtained.   PROCEDURE NOTE: Speculum placed in the vagina.  Cervix visualized.  Cleaned with Betadine x 2.  Grasped anteriorly with a single tooth tenaculum.  Uterus sounded to 13 cm.   [place] endometrial biopsy was performed using aseptic technique  with iodine preparation.  Adequate sampling was obtained with minimal blood loss.  The patient tolerated the procedure well.  Disposition will be pending pathology.  IUD Insertion Procedure Note Patient identified, informed consent performed, consent signed.   Discussed risks of irregular bleeding, cramping, infection, malpositioning or misplacement of the IUD outside the uterus which may require further procedure such as laparoscopy, risk of failure <1%. Time out was performed.  Urine pregnancy test negative.  IUD placed per manufacturer's recommendations. Placement in proper  position in the uterus confirmed by watching on the abdominal ultrasound.  Strings trimmed to 3 cm. Tenaculum was removed, good hemostasis noted.  Patient tolerated procedure well.  Bimanual exam performed. Patient did not have uterine tenderness. She did not have adnexal tenderness of fullness. No CMT.   Patient was given post-procedure instructions.  She was advised to have backup contraception for one week.  Patient was also asked to check IUD strings periodically and follow up in 4 weeks for IUD check.  Will follow up with patient about endometrial biopsy Patient will give urine sample for UA Will consider CT to evaluate abdominal pain  Follow up in 4 weeks  Adelene Idler MD [facility], [facility] 04/15/2018  3:36 PM

## 2018-05-13 ENCOUNTER — Encounter: Payer: Self-pay | Admitting: Obstetrics and Gynecology

## 2018-05-13 ENCOUNTER — Ambulatory Visit (INDEPENDENT_AMBULATORY_CARE_PROVIDER_SITE_OTHER): Payer: BLUE CROSS/BLUE SHIELD | Admitting: Obstetrics and Gynecology

## 2018-05-13 ENCOUNTER — Other Ambulatory Visit: Payer: Self-pay

## 2018-05-13 VITALS — BP 104/68 | HR 81 | Ht 60.0 in | Wt 209.0 lb

## 2018-05-13 DIAGNOSIS — Z30431 Encounter for routine checking of intrauterine contraceptive device: Secondary | ICD-10-CM

## 2018-05-13 NOTE — Progress Notes (Signed)
  History of Present Illness:  Kristi Martinez is a 43 y.o. that had a Mirena IUD placed approximately 4 weeks ago. Since that time, she states that she has had no bleeding and pain  PMHx: She  has a past medical history of Abortion in first trimester, Appendicitis (2002), Eczema, Herpes genitalis, and Vitiligo. Also,  has a past surgical history that includes Cholecystectomy; Appendectomy; and Cesarean section., family history includes Diabetes in her sister; Diabetes Mellitus II in her mother; Heart disease in her paternal grandmother; Hodgkin's lymphoma in her maternal grandmother; Hypertension in her father, mother, and paternal grandmother.,  reports that she has never smoked. She has never used smokeless tobacco. She reports that she does not drink alcohol or use drugs. Current Meds  Medication Sig  . cetirizine (ZYRTEC) 10 MG tablet Take 10 mg by mouth daily.  Marland Kitchen levonorgestrel (MIRENA) 20 MCG/24HR IUD 1 each by Intrauterine route once.   Current Facility-Administered Medications for the 05/13/18 encounter (Office Visit) with Natale Milch, MD  Medication  . levonorgestrel (MIRENA) 20 MCG/24HR IUD  .  Also, has No Known Allergies..  ROS  Physical Exam:  BP 104/68   Pulse 81   Ht 5' (1.524 m)   Wt 209 lb (94.8 kg)   LMP 05/05/2018 (Exact Date)   BMI 40.82 kg/m  Body mass index is 40.82 kg/m. Constitutional: Well nourished, well developed female in no acute distress.  Abdomen: diffusely non tender to palpation, non distended, and no masses, hernias Neuro: Grossly intact Psych:  Normal mood and affect.    Pelvic exam:  Two IUD strings present seen coming from the cervical os. EGBUS, vaginal vault and cervix: within normal limits  Assessment: IUD strings present in proper location; pt doing well  Plan: She was told to continue to use barrier contraception, in order to prevent any STIs, and to take a home pregnancy test or call us if she ever thinks she may be pregnant,  and that her IUD expires in 5 years.  Discussed scant tissue on biopsy. Discussed adenomyosis as a possible origin of her heavy vaginal bleeding and pain. Discussed future management options if her IUD stops working, such as hysterectomy or replacement of IUD early. Symptoms were relieved for 3 years with her prior IUD.   She was amenable to this plan and we will see her back in 1 year/PRN.  A total of 15 minutes were spent face-to-face with the patient during this encounter and over half of that time dealt with counseling and coordination of care.   Adelene Idler MD [facility], [facility] 05/13/2018 5:34 PM

## 2019-06-12 ENCOUNTER — Ambulatory Visit: Payer: BLUE CROSS/BLUE SHIELD

## 2019-08-05 ENCOUNTER — Encounter: Payer: Self-pay | Admitting: Advanced Practice Midwife

## 2019-08-05 ENCOUNTER — Other Ambulatory Visit: Payer: Self-pay

## 2019-08-05 ENCOUNTER — Ambulatory Visit (INDEPENDENT_AMBULATORY_CARE_PROVIDER_SITE_OTHER): Payer: BC Managed Care – PPO | Admitting: Advanced Practice Midwife

## 2019-08-05 VITALS — BP 113/73 | HR 68 | Ht 60.0 in | Wt 219.0 lb

## 2019-08-05 DIAGNOSIS — N920 Excessive and frequent menstruation with regular cycle: Secondary | ICD-10-CM | POA: Diagnosis not present

## 2019-08-05 DIAGNOSIS — N946 Dysmenorrhea, unspecified: Secondary | ICD-10-CM

## 2019-08-05 DIAGNOSIS — T8332XA Displacement of intrauterine contraceptive device, initial encounter: Secondary | ICD-10-CM | POA: Diagnosis not present

## 2019-08-05 MED ORDER — ORILISSA 150 MG PO TABS: 1.0000 | ORAL_TABLET | Freq: Every day | ORAL | 5 refills | Status: DC

## 2019-08-05 NOTE — Progress Notes (Signed)
Patient ID: Kristi Martinez, female   DOB: [birth year], 44 y.o.   MRN: [id number]  Reason for Consult: IUD removal (Cramping with IUD, minimal bleeding)    Subjective:  HPI:   Kristi Martinez is a 44 y.o. female being seen for IUD removal secondary to pelvic pain. She has had ongoing pain with periods and also heavy bleeding. She initially got an IUD for control of bleeding and pain about 4 years ago. About 1 year ago she had her 44 year old IUD removed and replaced since she was worried it was causing her pain. She has had some improvement in pain level with her current IUD, however she is still having pain. This past month she reports her pain was once again severe. She would like to have the IUD removed since she now thinks it could be the source of her pain and it is not controlling her heavy flow. She is interested in exploring other options for cycle and pain control besides hormones.  She reports being unable to function 2 days of every month from the severity of the pain.  She does not know of any uterine fibroid history. She has not been evaluated for endometriosis.   IUD strings were neither visualized or felt at today's visit. We discussed, follow up and  treatment options. She would like to try medication option for treatment of endometriosis and also have a gyn ultrasound and follow up with MD for further discussion of treatment.   Past Medical History:  Diagnosis Date  . Abortion in first trimester   . Appendicitis 2002  . Eczema   . Herpes genitalis   . Vitiligo    Family History  Problem Relation Age of Onset  . Diabetes Mellitus II Mother   . Hypertension Mother   . Hypertension Father   . Diabetes Sister        Gestational  . Hodgkin's lymphoma Maternal Grandmother   . Heart disease Paternal Grandmother        Had CABG  . Hypertension Paternal Grandmother    Past Surgical History:  Procedure Laterality Date  . APPENDECTOMY    . CESAREAN SECTION    .  CHOLECYSTECTOMY      Short Social History:  Social History   Tobacco Use  . Smoking status: Never Smoker  . Smokeless tobacco: Never Used  Substance Use Topics  . Alcohol use: No    No Known Allergies  Current Outpatient Medications  Medication Sig Dispense Refill  . cetirizine (ZYRTEC) 10 MG tablet Take 10 mg by mouth daily.    . Elagolix Sodium (ORILISSA) 150 MG TABS Take 1 tablet by mouth daily. 30 tablet 5   Current Facility-Administered Medications  Medication Dose Route Frequency Provider Last Rate Last Admin  . levonorgestrel (MIRENA) 20 MCG/24HR IUD   Intrauterine Once Schuman, Christanna R, MD        Review of Systems  Constitutional: Negative for chills and fever.  HENT: Negative for congestion, ear discharge, ear pain, hearing loss, sinus pain and sore throat.   Eyes: Negative for blurred vision and double vision.  Respiratory: Negative for cough, shortness of breath and wheezing.   Cardiovascular: Negative for chest pain, palpitations and leg swelling.  Gastrointestinal: Negative for abdominal pain, blood in stool, constipation, diarrhea, heartburn, melena, nausea and vomiting.  Genitourinary: Negative for dysuria, flank pain, frequency, hematuria and urgency.       Pelvic pain, heavy menstrual bleeding  Musculoskeletal: Negative for back pain, joint  pain and myalgias.  Skin: Negative for itching and rash.  Neurological: Negative for dizziness, tingling, tremors, sensory change, speech change, focal weakness, seizures, loss of consciousness, weakness and headaches.  Endo/Heme/Allergies: Negative for environmental allergies. Does not bruise/bleed easily.  Psychiatric/Behavioral: Negative for depression, hallucinations, memory loss, substance abuse and suicidal ideas. The patient is not nervous/anxious and does not have insomnia.         Objective:  Objective   Vitals:   08/05/19 0905  BP: 113/73  Pulse: 68  Weight: 219 lb (99.3 kg)  Height: 5' (1.524 m)    Body mass index is 42.77 kg/m. Constitutional: Well nourished, obese female, in no acute distress.  HEENT: normal Skin: Warm and dry.  Cardiovascular: Regular rate and rhythm.   Extremity: trace edema  Respiratory: Clear to auscultation bilateral. Normal respiratory effort Neuro: DTRs 2+, Cranial nerves grossly intact Psych: Alert and Oriented x3. No memory deficits. Normal mood and affect.  MS: normal gait, normal bilateral lower extremity ROM/strength/stability.  Pelvic exam: (female chaperone present) is limited by body habitus EGBUS: within normal limits Vagina: within normal limits and with normal mucosa  Cervix: unable to see more than edge of cervix, attempted to bring into view with large q-tip, no strings visible, manual exam- no strings felt   Assessment/Plan:     44 y.o. female with unsuccessful attempt to remove IUD  Gyn ultrasound with MD follow up for IUD removal (if still in place) and to discuss treatment options for heavy and painful menstrual bleeding Rx Freida Busman for empirical treatment of endometriosis   Rod Can CNM

## 2019-08-05 NOTE — Patient Instructions (Signed)
Endometriosis  Endometriosis is a condition in which the tissue that lines the uterus (endometrium) grows outside of its normal location. The tissue may grow in many locations close to the uterus, but it commonly grows on the ovaries, fallopian tubes, vagina, or bowel. When the uterus sheds the endometrium every menstrual cycle, there is bleeding wherever the endometrial tissue is located. This can cause pain because blood is irritating to tissues that are not normally exposed to it. What are the causes? The cause of endometriosis is not known. What increases the risk? You may be more likely to develop endometriosis if you:  Have a family history of endometriosis.  Have never given birth.  Started your period at age 10 or younger.  Have high levels of estrogen in your body.  Were exposed to a certain medicine (diethylstilbestrol) before you were born (in utero).  Had low birth weight.  Were born as a twin, triplet, or other multiple.  Have a BMI of less than 25. BMI is an estimate of body fat and is calculated from height and weight. What are the signs or symptoms? Often, there are no symptoms of this condition. If you do have symptoms, they may:  Vary depending on where your endometrial tissue is growing.  Occur during your menstrual period (most common) or midcycle.  Come and go, or you may go months with no symptoms at all.  Stop with menopause. Symptoms may include:  Pain in the back or abdomen.  Heavier bleeding during periods.  Pain during sex.  Painful bowel movements.  Infertility.  Pelvic pain.  Bleeding more than once a month. How is this diagnosed? This condition is diagnosed based on your symptoms and a physical exam. You may have tests, such as:  Blood tests and urine tests. These may be done to help rule out other possible causes of your symptoms.  Ultrasound, to look for abnormal tissues.  An X-ray of the lower bowel (barium enema).  An  ultrasound that is done through the vagina (transvaginally).  CT scan.  MRI.  Laparoscopy. In this procedure, a lighted, pencil-sized instrument called a laparoscope is inserted into your abdomen through an incision. The laparoscope allows your health care provider to look at the organs inside your body and check for abnormal tissue to confirm the diagnosis. If abnormal tissue is found, your health care provider may remove a small piece of tissue (biopsy) to be examined under a microscope. How is this treated? Treatment for this condition may include:  Medicines to relieve pain, such as NSAIDs.  Hormone therapy. This involves using artificial (synthetic) hormones to reduce endometrial tissue growth. Your health care provider may recommend using a hormonal form of birth control, or other medicines.  Surgery. This may be done to remove abnormal endometrial tissue. ? In some cases, tissue may be removed using a laparoscope and a laser (laparoscopic laser treatment). ? In severe cases, surgery may be done to remove the fallopian tubes, uterus, and ovaries (hysterectomy). Follow these instructions at home:  Take over-the-counter and prescription medicines only as told by your health care provider.  Do not drive or use heavy machinery while taking prescription pain medicine.  Try to avoid activities that cause pain, including sexual activity.  Keep all follow-up visits as told by your health care provider. This is important. Contact a health care provider if:  You have pain in the area between your hip bones (pelvic area) that occurs: ? Before, during, or after your period. ?   In between your period and gets worse during your period. ? During or after sex. ? With bowel movements or urination, especially during your period.  You have problems getting pregnant.  You have a fever. Get help right away if:  You have severe pain that does not get better with medicine.  You have severe  nausea and vomiting, or you cannot eat without vomiting.  You have pain that affects only the lower, right side of your abdomen.  You have abdominal pain that gets worse.  You have abdominal swelling.  You have blood in your stool. This information is not intended to replace advice given to you by your health care provider. Make sure you discuss any questions you have with your health care provider. Document Revised: 01/31/2017 Document Reviewed: 07/22/2015 Elsevier Patient Education  2020 Elsevier Inc.  

## 2019-08-16 ENCOUNTER — Ambulatory Visit (INDEPENDENT_AMBULATORY_CARE_PROVIDER_SITE_OTHER): Payer: BC Managed Care – PPO

## 2019-08-16 ENCOUNTER — Other Ambulatory Visit: Payer: Self-pay | Admitting: Advanced Practice Midwife

## 2019-08-16 ENCOUNTER — Encounter: Payer: Self-pay | Admitting: Obstetrics & Gynecology

## 2019-08-16 ENCOUNTER — Other Ambulatory Visit: Payer: Self-pay

## 2019-08-16 ENCOUNTER — Ambulatory Visit (INDEPENDENT_AMBULATORY_CARE_PROVIDER_SITE_OTHER): Payer: BC Managed Care – PPO | Admitting: Obstetrics & Gynecology

## 2019-08-16 VITALS — BP 120/80 | Ht 60.0 in | Wt 215.0 lb

## 2019-08-16 DIAGNOSIS — N920 Excessive and frequent menstruation with regular cycle: Secondary | ICD-10-CM

## 2019-08-16 DIAGNOSIS — N946 Dysmenorrhea, unspecified: Secondary | ICD-10-CM | POA: Diagnosis not present

## 2019-08-16 DIAGNOSIS — Z30431 Encounter for routine checking of intrauterine contraceptive device: Secondary | ICD-10-CM

## 2019-08-16 DIAGNOSIS — T8332XA Displacement of intrauterine contraceptive device, initial encounter: Secondary | ICD-10-CM

## 2019-08-16 MED ORDER — LO LOESTRIN FE 1 MG-10 MCG / 10 MCG PO TABS: 1.0000 | ORAL_TABLET | Freq: Every day | ORAL | 3 refills | Status: DC

## 2019-08-16 NOTE — Progress Notes (Signed)
HPI: Pt has heavy and painful periods, trying to find solution.  IUD no help.  Checking on IUD today w [place].  Ultrasound demonstrates no IUD seen.  Small fibroid.  PMHx: She  has a past medical history of Abortion in first trimester, Appendicitis (2002), Eczema, Herpes genitalis, and Vitiligo. Also,  has a past surgical history that includes Cholecystectomy; Appendectomy; and Cesarean section., family history includes Diabetes in her sister; Diabetes Mellitus II in her mother; Heart disease in her paternal grandmother; Hodgkin's lymphoma in her maternal grandmother; Hypertension in her father, mother, and paternal grandmother.,  reports that she has never smoked. She has never used smokeless tobacco. She reports that she does not drink alcohol and does not use drugs.  She has a current medication list which includes the following prescription(s): cetirizine and lo loestrin fe. Also, has No Known Allergies.  Review of Systems  All other systems reviewed and are negative.   Objective: BP 120/80   Ht 5' (1.524 m)   Wt 215 lb (97.5 kg)   BMI 41.99 kg/m   Physical examination Constitutional NAD, Conversant  Skin No rashes, lesions or ulceration.   Extremities: Moves all appropriately.  Normal ROM for age. No lymphadenopathy.  Neuro: Grossly intact  Psych: Oriented to PPT.  Normal mood. Normal affect.   US PELVIC COMPLETE WITH TRANSVAGINAL  Result Date: 08/16/2019 Patient Name: Kristi Martinez DOB: 11-01-1975 MRN: [id number] ULTRASOUND REPORT Location: [facility] Date of Service: 08/16/2019 Indications:Abnormal Uterine Bleeding and pelvic pain Findings: The uterus is retroverted and measures 11.1 x 6.9 x 6.8 cm. Echo texture is heterogenous with evidence of focal masses. Within the uterus there is one fibroid seen measuring: Fibroid 1:16.0 x 14.7 x 21.1 mm subserosal right The Endometrium measures 12.8 mm. The IUD is not visible. Right Ovary measures 3.5 x 2.3 x 2.3 cm. It is normal in  appearance. Left Ovary measures 2.9 x 2.7 x 1.8 cm. It is normal in appearance. Survey of the adnexa demonstrates no adnexal masses. There is no free fluid in the cul de sac. Impression: 1. There is one subserosal fibroid seen. 2. The IUD is not visualized. 3. Normal appearing ovaries. Recommendations: 1.Clinical correlation with the patient's History and Physical Exam. Deanna Artis, RT Review of ULTRASOUND.    I have personally reviewed images and report of recent ultrasound done at [facility].    Plan of management to be discussed with patient. Annamarie Major, MD, [facility], [facility] 08/16/2019  11:47 AM   Assessment:  IUD check up - Plan: DG Abd 2 Views  Menorrhagia with regular cycle  Severe dysmenorrhea  1. IUD not seen, expulsion vs perforation.  Perforation unlikely as had f.u exam after placement (05/2018) w strings visualized.  Will order xray. 2. Options for pain and bleeding as well as birth control. All options discussed.   Hormones for birth control, period control, possible pain control.  IUD no help in past.  OCP or Depo as options. Tubal and ablation for birth control and period control.  Pt says she cannot afford these options. Orilissa for pain control.  Is not birth control.  Not covered by insurance as far as pt can tell (was sent in earlier).    Pt prefers OCP and to wait and see how other sx's respond.  Follow up 3 mos.  If xray does show IUD in abdomen, then laparoscopy to remove and possible BTL then too.   A total of 20 minutes were  spent face-to-face with the patient as well as preparation, review, communication, and documentation during this encounter.   Barnett Applebaum, MD, [facility], [facility] 08/16/2019  11:50 AM

## 2019-08-16 NOTE — Patient Instructions (Signed)
Options for Bleeding:  Pills, Depo shot, IUD (no), other hormones Options for pain: control period w hormones, Orilissa for possible endometriosis, hysterectomy Options for Birth Control: Pills, Depo, IUD, Tubal ligation   Ethinyl Estradiol; Norethindrone Acetate; Ferrous fumarate tablets or capsules What is this medicine? ETHINYL ESTRADIOL; NORETHINDRONE ACETATE; FERROUS FUMARATE (ETH in il es tra DYE ole; nor eth IN drone AS e tate; FER [place] FUE ma rate) is an oral contraceptive. The products combine two types of female hormones, an estrogen and a progestin. They are used to prevent ovulation and pregnancy. Some products are also used to treat acne in females. This medicine may be used for other purposes; ask your health care provider or pharmacist if you have questions. COMMON BRAND NAME(S): Aurovela [address] 1/20, Aurovela Fe, [place] [address] Fe, [place], [address], [place] 1.5/30, [place] Fe 1/20, Hailey 24 Fe, Hailey Fe 1.5/30, Junel Fe 1.5/30, Junel Fe 1/20, Jun[place] Fe 24, [place] Fe, Lo Loestrin Fe, Loestrin 24 Fe, Loestrin FE 1.5/30, Loestrin FE 1/20, Lom[place] 24 Fe, Microgestin 24 Fe, Microgestin Fe 1.5/30, Microgestin Fe 1/20, Tarina 24 Fe, Tarina Fe 1/20, [place], Tilia Fe, Tri-Legest Fe What should I tell my health care provider before I take this medicine? They need to know if you have any of these conditions:  abnormal vaginal bleeding  blood vessel disease  breast, cervical, endometrial, ovarian, liver, or uterine cancer  diabetes  gallbladder disease  heart disease or recent heart attack  high blood pressure  high cholesterol  history of blood clots  kidney disease  liver disease  migraine headaches  smoke tobacco  stroke  systemic lupus erythematosus (SLE)  an unusual or allergic reaction to estrogens, progestins, other medicines, foods, dyes, or preservatives  pregnant or trying to get pregnant  breast-feeding How should I use this  medicine? Take this medicine by mouth. To reduce nausea, this medicine may be taken with food. Follow the directions on the prescription label. Take this medicine at the same time each day and in the order directed on the package. Do not take your medicine more often than directed. A patient package insert for the product will be given with each prescription and refill. Read this sheet carefully each time. The sheet may change frequently. Contact your pediatrician regarding the use of this medicine in children. Special care may be needed. This medicine has been used in female children who have started having menstrual periods. Overdosage: If you think you have taken too much of this medicine contact a poison control center or emergency room at once. NOTE: This medicine is only for you. Do not share this medicine with others. What if I miss a dose? If you miss a dose, refer to the patient information sheet you received with your medicine for direction. If you miss more than one pill, this medicine may not be as effective and you may need to use another form of birth control. What may interact with this medicine? Do not take this medicine with the following medication:  dasabuvir; ombitasvir; paritaprevir; ritonavir  ombitasvir; paritaprevir; ritonavir This medicine may also interact with the following medications:  acetaminophen  antibiotics or medicines for infections, especially rifampin, rifabutin, rifapentine, and griseofulvin, and possibly penicillins or tetracyclines  aprepitant  ascorbic acid (vitamin C)  atorvastatin  barbiturate medicines, such as phenobarbital  bosentan  carbamazepine  caffeine  clofibrate  cyclosporine  dantrolene  doxercalciferol  felbamate  grapefruit juice  hydrocortisone  medicines for anxiety or  sleeping problems, such as diazepam or temazepam  medicines for diabetes, including pioglitazone  mineral  oil  modafinil  mycophenolate  nefazodone  oxcarbazepine  phenytoin  prednisolone  ritonavir or other medicines for HIV infection or AIDS  rosuvastatin  selegiline  soy isoflavones supplements  St. John's wort  tamoxifen or raloxifene  theophylline  thyroid hormones  topiramate  warfarin This list may not describe all possible interactions. Give your health care provider a list of all the medicines, herbs, non-prescription drugs, or dietary supplements you use. Also tell them if you smoke, drink alcohol, or use illegal drugs. Some items may interact with your medicine. What should I watch for while using this medicine? Visit your doctor or health care professional for regular checks on your progress. You will need a regular breast and pelvic exam and Pap smear while on this medicine. Use an additional method of contraception during the first cycle that you take these tablets. If you have any reason to think you are pregnant, stop taking this medicine right away and contact your doctor or health care professional. If you are taking this medicine for hormone related problems, it may take several cycles of use to see improvement in your condition. Smoking increases the risk of getting a blood clot or having a stroke while you are taking birth control pills, especially if you are more than 44 years old. You are strongly advised not to smoke. This medicine can make your body retain fluid, making your fingers, hands, or ankles swell. Your blood pressure can go up. Contact your doctor or health care professional if you feel you are retaining fluid. This medicine can make you more sensitive to the sun. Keep out of the sun. If you cannot avoid being in the sun, wear protective clothing and use sunscreen. Do not use sun lamps or tanning beds/booths. If you wear contact lenses and notice visual changes, or if the lenses begin to feel uncomfortable, consult your eye care specialist. In  some women, tenderness, swelling, or minor bleeding of the gums may occur. Notify your dentist if this happens. Brushing and flossing your teeth regularly may help limit this. See your dentist regularly and inform your dentist of the medicines you are taking. If you are going to have elective surgery, you may need to stop taking this medicine before the surgery. Consult your health care professional for advice. This medicine does not protect you against HIV infection (AIDS) or any other sexually transmitted diseases. What side effects may I notice from receiving this medicine? Side effects that you should report to your doctor or health care professional as soon as possible:  allergic reactions like skin rash, itching or hives, swelling of the face, lips, or tongue  breast tissue changes or discharge  changes in vaginal bleeding during your period or between your periods  changes in vision  chest pain  confusion  coughing up blood  dizziness  feeling faint or lightheaded  headaches or migraines  leg, arm or groin pain  loss of balance or coordination  severe or sudden headaches  stomach pain (severe)  sudden shortness of breath  sudden numbness or weakness of the face, arm or leg  symptoms of vaginal infection like itching, irritation or unusual discharge  tenderness in the upper abdomen  trouble speaking or understanding  vomiting  yellowing of the eyes or skin Side effects that usually do not require medical attention (report to your doctor or health care professional if they continue or  are bothersome):  breakthrough bleeding and spotting that continues beyond the 3 initial cycles of pills  breast tenderness  mood changes, anxiety, depression, frustration, anger, or emotional outbursts  increased sensitivity to sun or ultraviolet light  nausea  skin rash, acne, or brown spots on the skin  weight gain (slight) This list may not describe all possible  side effects. Call your doctor for medical advice about side effects. You may report side effects to FDA at 1-800-FDA-1088. Where should I keep my medicine? Keep out of the reach of children. Store at room temperature between 15 and 30 degrees C (59 and 86 degrees F). Throw away any unused medicine after the expiration date. NOTE: This sheet is a summary. It may not cover all possible information. If you have questions about this medicine, talk to your doctor, pharmacist, or health care provider.  2020 Elsevier/Gold Standard (2015-10-30 08:04:41)

## 2019-08-25 ENCOUNTER — Telehealth: Payer: Self-pay | Admitting: Obstetrics & Gynecology

## 2019-09-14 NOTE — Telephone Encounter (Signed)
Left message to return call 

## 2019-09-14 NOTE — Telephone Encounter (Signed)
-----   Message from Nadara Mustard, MD sent at 09/10/2019  7:28 AM EDT ----- Regarding: ck on pt She has not done XRAY as ordered to check on IUD.  Does she need help scheduling?  How is she doing?

## 2019-10-02 ENCOUNTER — Ambulatory Visit: Payer: BC Managed Care – PPO

## 2019-11-22 ENCOUNTER — Ambulatory Visit (INDEPENDENT_AMBULATORY_CARE_PROVIDER_SITE_OTHER): Payer: BC Managed Care – PPO

## 2019-11-22 ENCOUNTER — Ambulatory Visit
Admission: EM | Admit: 2019-11-22 | Discharge: 2019-11-22 | Disposition: A | Discharge status: Home / Self Care | Payer: BC Managed Care – PPO | Attending: Physician Assistant | Admitting: Physician Assistant

## 2019-11-22 ENCOUNTER — Other Ambulatory Visit: Payer: Self-pay

## 2019-11-22 DIAGNOSIS — R0602 Shortness of breath: Secondary | ICD-10-CM

## 2019-11-22 DIAGNOSIS — J1282 Pneumonia due to coronavirus disease 2019: Secondary | ICD-10-CM

## 2019-11-22 DIAGNOSIS — U071 COVID-19: Secondary | ICD-10-CM | POA: Diagnosis not present

## 2019-11-22 MED ORDER — PSEUDOEPH-BROMPHEN-DM 30-2-10 MG/5ML PO SYRP: 10.0000 mL | ORAL_SOLUTION | Freq: Four times a day (QID) | ORAL | 1 refills | Status: AC | PRN

## 2019-11-22 MED ORDER — ALBUTEROL SULFATE HFA 108 (90 BASE) MCG/ACT IN AERS: 1.0000 | INHALATION_SPRAY | RESPIRATORY_TRACT | 0 refills | Status: DC | PRN

## 2019-11-22 MED ORDER — PREDNISONE 10 MG PO TABS: 20.0000 mg | ORAL_TABLET | Freq: Every day | ORAL | 0 refills | Status: AC

## 2019-11-22 NOTE — Discharge Instructions (Signed)
Your x-ray shows that you have viral pneumonia.  This is consistent with Covid pneumonia since you were diagnosed with that a couple weeks ago.  We do not have many options to treat Covid pneumonia.  I have sent an inhaler for you to use for your shortness of breath.  Begin cough medicine as well.  Increase rest and fluid intake.  Start prednisone which may also help you as well.  As we discussed, if at any point you develop a fever greater than 101, chest pain, worsening breathing difficulty, or you are feeling no better after a week you need to go to the emergency room.

## 2019-11-22 NOTE — ED Triage Notes (Addendum)
Patient complains of cough and dyspnea on exertion. Denies other symptoms. Reports she and her son tested positive for covid earlier this month.

## 2019-11-22 NOTE — ED Provider Notes (Signed)
[facility]    CSN: [id number] Arrival date & time: 11/22/19  1623      History   Chief Complaint Chief Complaint  Patient presents with  . Cough  . Shortness of Breath    HPI Kristi Martinez is a 44 y.o. female.   Patient presents for cough and feeling short of breath.  Patient states that she tested positive for Covid 2 weeks ago.  She says that over the past week she has felt shortness of breath when she walks and when she has a coughing fit.  She denies any known fevers.  She does admit to some fatigue and decreased appetite.  She admits to about a 10-15 pound weight loss since having Covid.  Has not been taking any over-the-counter medication.  She denies any chest pain, weakness, vomiting, abdominal pain, diarrhea, leg swelling or pain.  She denies any history of diabetes or cardiopulmonary disease.  She states that she has felt about the same for the past week and does not feel any worse today than yesterday.  She has no other symptoms, complaints or concerns.  HPI  Past Medical History:  Diagnosis Date  . Abortion in first trimester   . Appendicitis 2002  . Eczema   . Herpes genitalis   . Vitiligo     Patient Active Problem List   Diagnosis Date Noted  . Malpositioned intrauterine device 11/19/2017    Past Surgical History:  Procedure Laterality Date  . APPENDECTOMY    . CESAREAN SECTION    . CHOLECYSTECTOMY      OB History    Gravida  2   Para  2   Term  2   Preterm      AB      Living  2     SAB      TAB      Ectopic      Multiple      Live Births  2            Home Medications    Prior to Admission medications   Medication Sig Start Date End Date Taking? Authorizing Provider  albuterol (VENTOLIN HFA) 108 (90 Base) MCG/ACT inhaler Inhale 1-2 puffs into the lungs every 4 (four) hours as needed for up to 14 days for wheezing or shortness of breath. 11/22/19 12/06/19  Eusebio Friendly B, PA-C    brompheniramine-pseudoephedrine-DM 30-2-10 MG/5ML syrup Take 10 mLs by mouth 4 (four) times daily as needed for up to 10 days. 11/22/19 12/02/19  Shirlee Latch, PA-C  cetirizine (ZYRTEC) 10 MG tablet Take 10 mg by mouth daily.    [provider]  Norethindrone-Ethinyl Estradiol-Fe Biphas (LO LOESTRIN FE) 1 MG-10 MCG / 10 MCG tablet Take 1 tablet by mouth daily. 08/16/19 11/08/19  Nadara Mustard, MD  predniSONE (DELTASONE) 10 MG tablet Take 2 tablets (20 mg total) by mouth daily for 5 days. 11/22/19 11/27/19  Shirlee Latch, PA-C    Family History Family History  Problem Relation Age of Onset  . Diabetes Mellitus II Mother   . Hypertension Mother   . Hypertension Father   . Diabetes Sister        Gestational  . Hodgkin's lymphoma Maternal Grandmother   . Heart disease Paternal Grandmother        Had CABG  . Hypertension Paternal Grandmother     Social History Social History   Tobacco Use  . Smoking status: Never Smoker  . Smokeless tobacco:  Never Used  Vaping Use  . Vaping Use: Never used  Substance Use Topics  . Alcohol use: No  . Drug use: No     Allergies   Patient has no known allergies.   Review of Systems Review of Systems  Constitutional: Positive for appetite change, fatigue and unexpected weight change. Negative for chills, diaphoresis and fever.  HENT: Positive for congestion. Negative for ear pain, rhinorrhea, sinus pressure, sinus pain and sore throat.   Respiratory: Positive for cough and shortness of breath.   Gastrointestinal: Negative for abdominal pain, nausea and vomiting.  Musculoskeletal: Negative for arthralgias and myalgias.  Skin: Negative for rash.  Neurological: Negative for weakness and headaches.  Hematological: Negative for adenopathy.     Physical Exam Triage Vital Signs ED Triage Vitals  Enc Vitals Group     BP 11/22/19 1703 114/76     Pulse Rate 11/22/19 1703 100     Resp 11/22/19 1703 (!) 22     Temp 11/22/19 1703 100.1  F (37.8 C)     Temp src --      SpO2 11/22/19 1703 94 %     Weight --      Height --      Head Circumference --      Peak Flow --      Pain Score 11/22/19 1700 4     Pain Loc --      Pain Edu? --      Excl. in GC? --    No data found.  Updated Vital Signs BP 114/76   Pulse 100   Temp 100.1 F (37.8 C)   Resp (!) 22   SpO2 97%     Physical Exam Vitals and nursing note reviewed.  Constitutional:      General: She is not in acute distress.    Appearance: Normal appearance. She is not ill-appearing or toxic-appearing.  HENT:     Head: Normocephalic and atraumatic.     Nose: Nose normal.     Mouth/Throat:     Mouth: Mucous membranes are moist.     Pharynx: Oropharynx is clear.  Eyes:     General: No scleral icterus.       Right eye: No discharge.        Left eye: No discharge.     Conjunctiva/sclera: Conjunctivae normal.  Cardiovascular:     Rate and Rhythm: Regular rhythm. Tachycardia present.     Heart sounds: Normal heart sounds.  Pulmonary:     Effort: Respiratory distress (mild, increased respiratory rate) present.     Breath sounds: Normal breath sounds. No stridor. No wheezing, rhonchi or rales.  Musculoskeletal:     Cervical back: Neck supple.  Skin:    General: Skin is dry.  Neurological:     General: No focal deficit present.     Mental Status: She is alert and oriented to person, place, and time. Mental status is at baseline.     Motor: No weakness.     Gait: Gait normal.  Psychiatric:        Mood and Affect: Mood normal.        Behavior: Behavior normal.        Thought Content: Thought content normal.      UC Treatments / Results  Labs (all labs ordered are listed, but only abnormal results are displayed) Labs Reviewed - No data to display  EKG   Radiology DG Chest 2 View  Result Date: 11/22/2019 CLINICAL DATA:  Shortness of breath. EXAM: CHEST - 2 VIEW COMPARISON:  None. FINDINGS: There are hazy bilateral airspace opacities with  prominent interstitial lung markings bilaterally. There is no pneumothorax. No large pleural effusion. The heart size is unremarkable. There is no acute osseous abnormality. IMPRESSION: Findings suspicious for an atypical infectious process such as viral pneumonia. Electronically Signed   By: Katherine Mantle M.D.   On: 11/22/2019 17:47    Procedures Procedures (including critical care time)  Medications Ordered in UC Medications - No data to display  Initial Impression / Assessment and Plan / UC Course  I have reviewed the triage vital signs and the nursing notes.  Pertinent labs & imaging results that were available during my care of the patient were reviewed by me and considered in my medical decision making (see chart for details).   Patient diagnosed with Covid 2 weeks ago, presenting today for 1 week history of shortness of breath.  Her oxygen at rest ranges from 94 to 97%.  Patient walked approximately 300 feet and oxygen only decreased to 93%.  We previously checked her ambulatory saturation before realizing that she had fake nails on.  At that time it appeared to drop to 88%.  However, patient remove the fake nail and she never became hypoxic.  She did have some mild respiratory distress and had to walk slow.  Her temperature was 100 degrees.  Other vital signs stable.  Patient otherwise healthy without any medical problems.  She does not appear toxic.  Chest x-ray consistent with viral pneumonia.  Treating at this time with albuterol inhaler, prednisone and Bromfed cough medication.  I spoke with patient and gave her strict ED precautions.  Patient agreeable.   Final Clinical Impressions(s) / UC Diagnoses   Final diagnoses:  Pneumonia due to COVID-19 virus  Shortness of breath     Discharge Instructions     Your x-ray shows that you have viral pneumonia.  This is consistent with Covid pneumonia since you were diagnosed with that a couple weeks ago.  We do not have many  options to treat Covid pneumonia.  I have sent an inhaler for you to use for your shortness of breath.  Begin cough medicine as well.  Increase rest and fluid intake.  Start prednisone which may also help you as well.  As we discussed, if at any point you develop a fever greater than 101, chest pain, worsening breathing difficulty, or you are feeling no better after a week you need to go to the emergency room.    ED Prescriptions    Medication Sig Dispense Auth. Provider   albuterol (VENTOLIN HFA) 108 (90 Base) MCG/ACT inhaler Inhale 1-2 puffs into the lungs every 4 (four) hours as needed for up to 14 days for wheezing or shortness of breath. 1 g Eusebio Friendly B, PA-C   predniSONE (DELTASONE) 10 MG tablet Take 2 tablets (20 mg total) by mouth daily for 5 days. 10 tablet Eusebio Friendly B, PA-C   brompheniramine-pseudoephedrine-DM 30-2-10 MG/5ML syrup Take 10 mLs by mouth 4 (four) times daily as needed for up to 10 days. 200 mL Shirlee Latch, PA-C     PDMP not reviewed this encounter.   Shirlee Latch, PA-C 11/22/19 1816

## 2021-02-06 NOTE — Telephone Encounter (Signed)
04/01/2018-IUD removed. Pt wants replacement. Given the discrepancy in uterine sounding vs u/s results, suggest IUD placement with u/s guidance. Mirena rcvd 04/15/2018

## 2021-03-22 IMAGING — CR DG CHEST 2V
2 series · 2 of 2 positions shown · non-contrast
Comparison: None.

CLINICAL DATA: Shortness of breath.

EXAM:
CHEST - 2 VIEW

[chest pa]
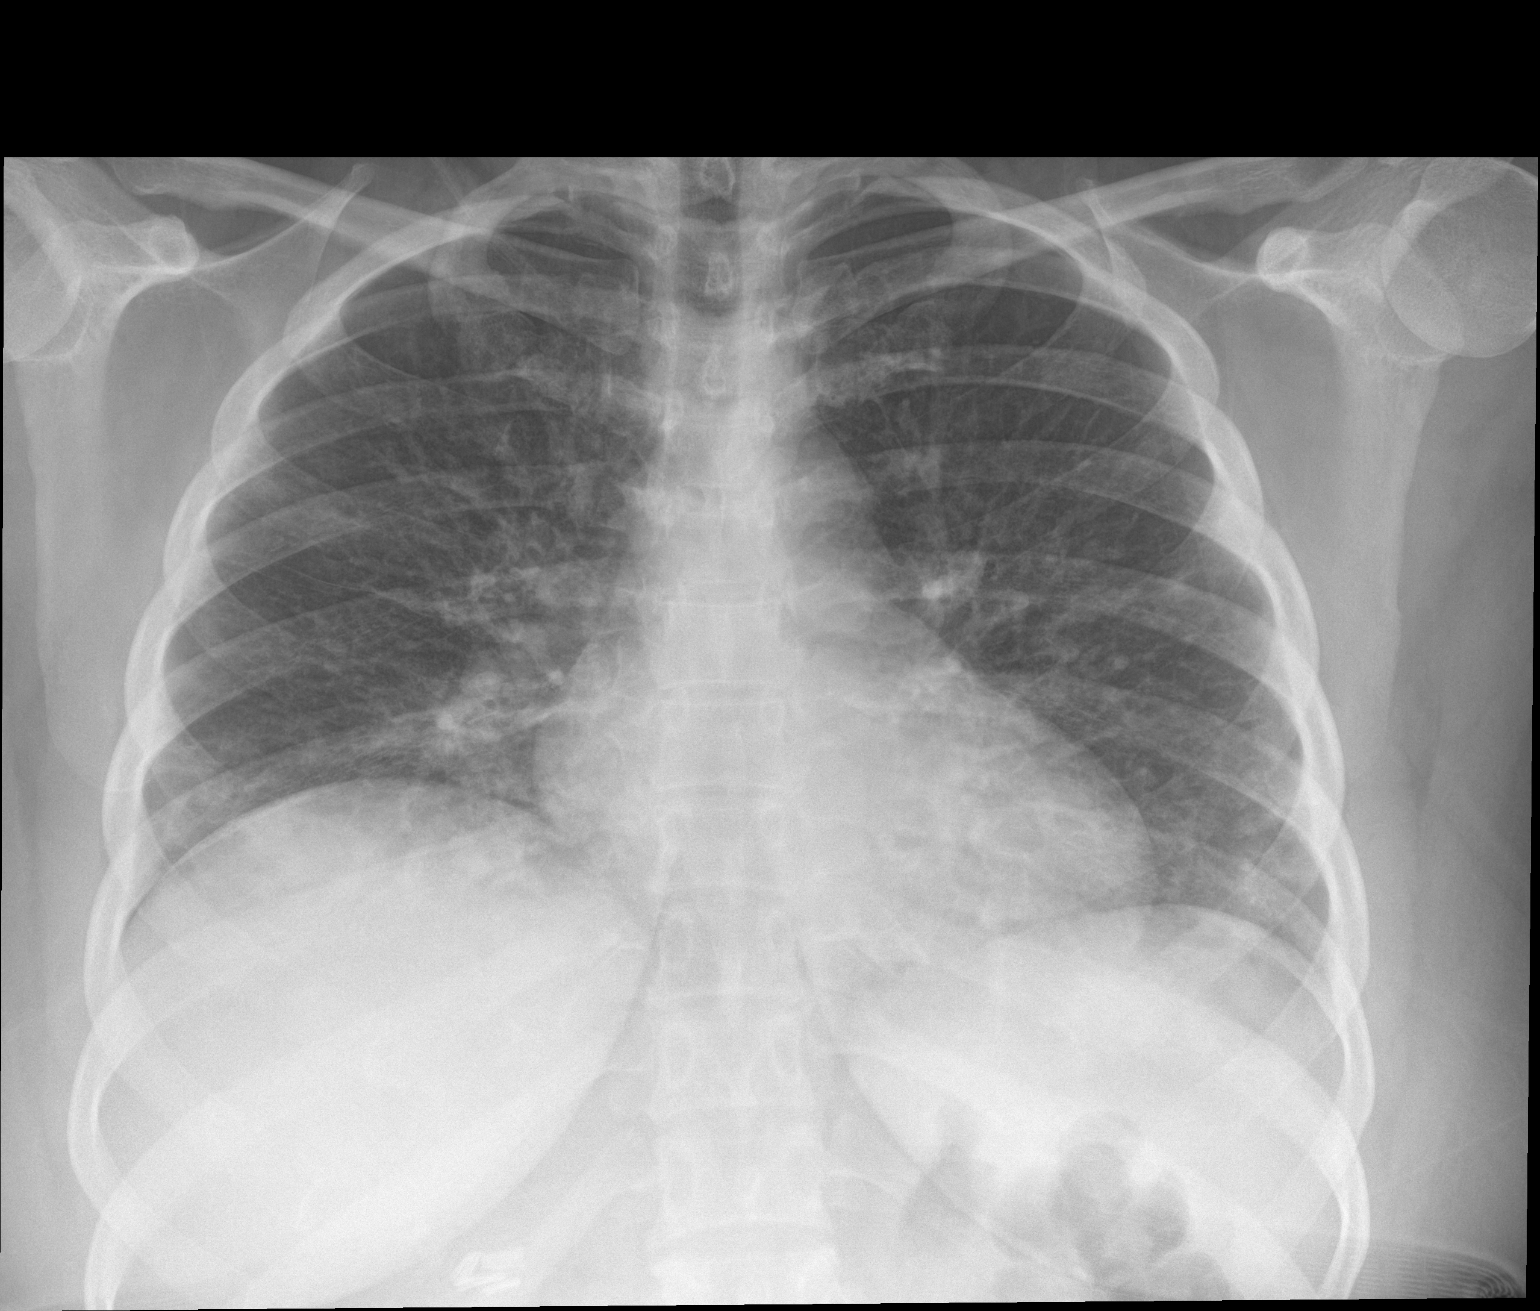

[chest lat]
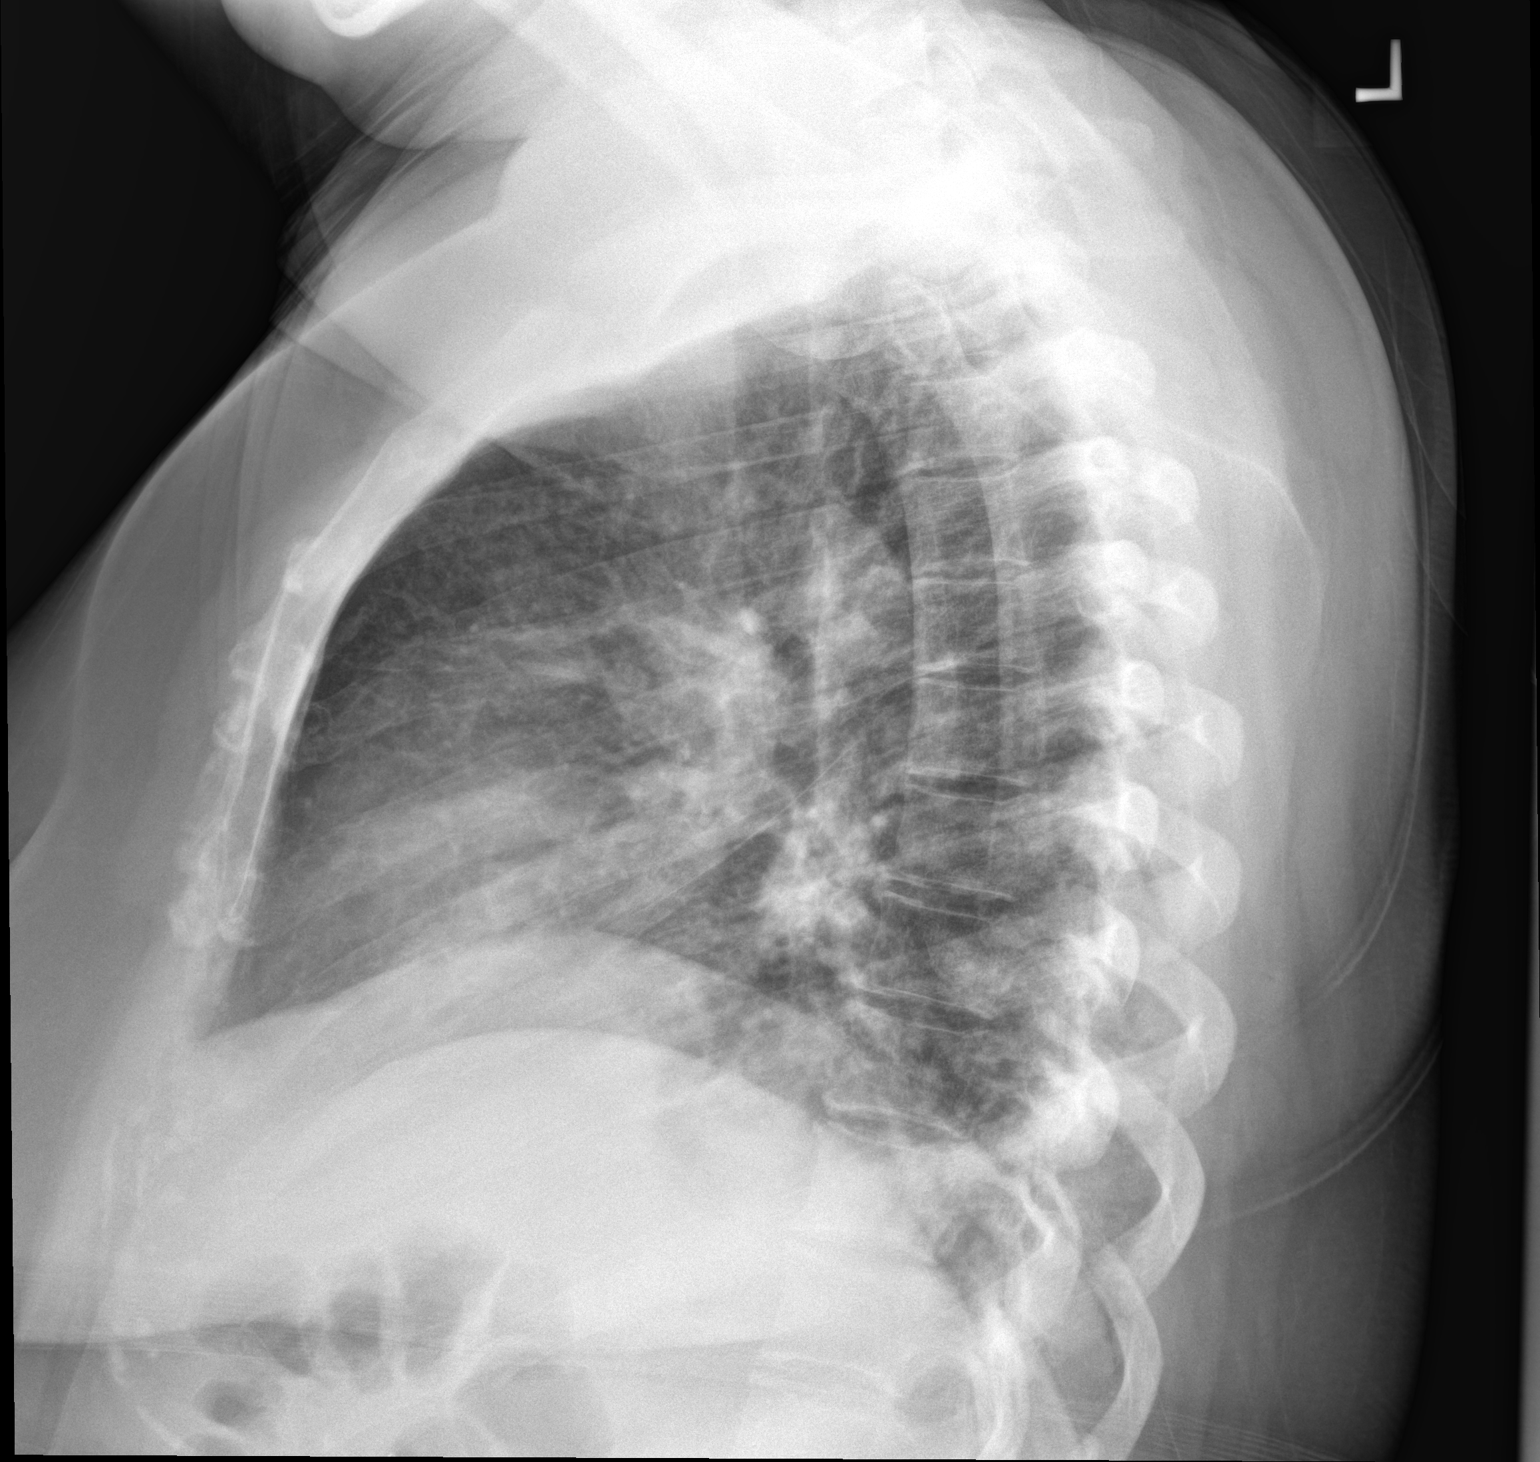

[2 of 2 positions shown; findings below may reference images not displayed]

FINDINGS: There are hazy bilateral airspace opacities with prominent
interstitial lung markings bilaterally. There is no pneumothorax. No
large pleural effusion. The heart size is unremarkable. There is no
acute osseous abnormality.
IMPRESSION: Findings suspicious for an atypical infectious process such as viral
pneumonia.

## 2023-03-07 ENCOUNTER — Ambulatory Visit: Payer: BC Managed Care – PPO | Admitting: Nurse Practitioner

## 2023-03-24 ENCOUNTER — Other Ambulatory Visit (HOSPITAL_COMMUNITY)
Admission: RE | Admit: 2023-03-24 | Discharge: 2023-03-24 | Disposition: A | Discharge status: Home / Self Care | Payer: BC Managed Care – PPO | Source: Ambulatory Visit | Attending: Nurse Practitioner | Admitting: Nurse Practitioner

## 2023-03-24 ENCOUNTER — Encounter: Payer: Self-pay | Admitting: Nurse Practitioner

## 2023-03-24 ENCOUNTER — Telehealth: Payer: Self-pay

## 2023-03-24 ENCOUNTER — Ambulatory Visit: Payer: BC Managed Care – PPO | Admitting: Nurse Practitioner

## 2023-03-24 VITALS — BP 118/82 | HR 75 | Temp 98.3°F | Ht 60.0 in | Wt 205.4 lb

## 2023-03-24 DIAGNOSIS — N920 Excessive and frequent menstruation with regular cycle: Secondary | ICD-10-CM | POA: Insufficient documentation

## 2023-03-24 DIAGNOSIS — Z6841 Body Mass Index (BMI) 40.0 and over, adult: Secondary | ICD-10-CM

## 2023-03-24 DIAGNOSIS — Z01419 Encounter for gynecological examination (general) (routine) without abnormal findings: Secondary | ICD-10-CM | POA: Diagnosis not present

## 2023-03-24 DIAGNOSIS — G4700 Insomnia, unspecified: Secondary | ICD-10-CM | POA: Insufficient documentation

## 2023-03-24 DIAGNOSIS — Z124 Encounter for screening for malignant neoplasm of cervix: Secondary | ICD-10-CM | POA: Diagnosis present

## 2023-03-24 DIAGNOSIS — Z1329 Encounter for screening for other suspected endocrine disorder: Secondary | ICD-10-CM | POA: Diagnosis not present

## 2023-03-24 DIAGNOSIS — Z1231 Encounter for screening mammogram for malignant neoplasm of breast: Secondary | ICD-10-CM

## 2023-03-24 DIAGNOSIS — Z13220 Encounter for screening for lipoid disorders: Secondary | ICD-10-CM

## 2023-03-24 DIAGNOSIS — Z23 Encounter for immunization: Secondary | ICD-10-CM | POA: Diagnosis not present

## 2023-03-24 DIAGNOSIS — Z1211 Encounter for screening for malignant neoplasm of colon: Secondary | ICD-10-CM

## 2023-03-24 LAB — LIPID PANEL
Cholesterol: 169 mg/dL (ref 0–200)
HDL: 63.7 mg/dL (ref >39.00)
LDL Cholesterol: 81 mg/dL (ref 0–99)
NonHDL: 105.73
Total CHOL/HDL Ratio: 3
Triglycerides: 122 mg/dL (ref 0.0–149.0)
VLDL: 24.4 mg/dL (ref 0.0–40.0)

## 2023-03-24 LAB — IBC + FERRITIN
Ferritin: 3.7 ng/mL — ABNORMAL LOW (ref 10.0–291.0)
Iron: 18 ug/dL — ABNORMAL LOW (ref 42–145)
Saturation Ratios: 4 % — ABNORMAL LOW (ref 20.0–50.0)
TIBC: 445.2 ug/dL (ref 250.0–450.0)
Transferrin: 318 mg/dL (ref 212.0–360.0)

## 2023-03-24 LAB — CBC WITH DIFFERENTIAL/PLATELET
Basophils Absolute: 0.1 10*3/uL (ref 0.0–0.1)
Basophils Relative: 1.1 % (ref 0.0–3.0)
Eosinophils Absolute: 0.3 10*3/uL (ref 0.0–0.7)
Eosinophils Relative: 2.9 % (ref 0.0–5.0)
HCT: 32.5 % — ABNORMAL LOW (ref 36.0–46.0)
Hemoglobin: 9.5 g/dL — ABNORMAL LOW (ref 12.0–15.0)
Lymphocytes Relative: 29.8 % (ref 12.0–46.0)
Lymphs Abs: 3.1 10*3/uL (ref 0.7–4.0)
MCHC: 29.3 g/dL — ABNORMAL LOW (ref 30.0–36.0)
MCV: 65.3 fL — ABNORMAL LOW (ref 78.0–100.0)
Monocytes Absolute: 0.4 10*3/uL (ref 0.1–1.0)
Monocytes Relative: 4.2 % (ref 3.0–12.0)
Neutro Abs: 6.3 10*3/uL (ref 1.4–7.7)
Neutrophils Relative %: 62 % (ref 43.0–77.0)
Platelets: 680 10*3/uL — ABNORMAL HIGH (ref 150.0–400.0)
RBC: 4.98 Mil/uL (ref 3.87–5.11)
RDW: 21.3 % — ABNORMAL HIGH (ref 11.5–15.5)
WBC: 10.2 10*3/uL (ref 4.0–10.5)

## 2023-03-24 LAB — COMPREHENSIVE METABOLIC PANEL
ALT: 22 U/L (ref 0–35)
AST: 20 U/L (ref 0–37)
Albumin: 4.1 g/dL (ref 3.5–5.2)
Alkaline Phosphatase: 89 U/L (ref 39–117)
BUN: 8 mg/dL (ref 6–23)
CO2: 28 meq/L (ref 19–32)
Calcium: 9.1 mg/dL (ref 8.4–10.5)
Chloride: 100 meq/L (ref 96–112)
Creatinine, Ser: 0.5 mg/dL (ref 0.40–1.20)
GFR: 111.59 mL/min (ref >60.00)
Glucose, Bld: 135 mg/dL — ABNORMAL HIGH (ref 70–99)
Potassium: 4 meq/L (ref 3.5–5.1)
Sodium: 139 meq/L (ref 135–145)
Total Bilirubin: 0.3 mg/dL (ref 0.2–1.2)
Total Protein: 7.9 g/dL (ref 6.0–8.3)

## 2023-03-24 LAB — TSH: TSH: 3.11 u[IU]/mL (ref 0.35–5.50)

## 2023-03-24 LAB — HEMOGLOBIN A1C: Hgb A1c MFr Bld: 8 % — ABNORMAL HIGH (ref 4.6–6.5)

## 2023-03-24 LAB — VITAMIN D 25 HYDROXY (VIT D DEFICIENCY, FRACTURES): VITD: 9.17 ng/mL — ABNORMAL LOW (ref 30.00–100.00)

## 2023-03-24 NOTE — Patient Instructions (Signed)
YOUR MAMMOGRAM IS DUE, PLEASE CALL AND GET THIS SCHEDULED! [facility] - call [phone]    

## 2023-03-24 NOTE — Assessment & Plan Note (Addendum)
Physical exam complete. We will check lab work as outlined and contact patient with the results. Last Pap smear was in 2019, last mammogram is unknown, and no previous colonoscopy. Perform Pap smear today. Schedule mammogram. Order Cologuard for colon cancer screening. Administer tetanus shot today. Declined flu vaccine. She has received one COVID vaccine and declines additional. Continue routine dental and eye exams. Encouraged to continue working on healthy diet and exercising regularly. Return to care in one year, sooner as needed.

## 2023-03-24 NOTE — Telephone Encounter (Signed)
VM left to have pt cb in regards to labs

## 2023-03-24 NOTE — Assessment & Plan Note (Addendum)
History of gestational diabetes and a family history of diabetes, and reports feeling sluggish after heavy meals. Order A1c to screen for diabetes. Consider diet modifications, focusing on increased protein and reduced carbohydrates. Encouraged to continue exercising regularly.

## 2023-03-24 NOTE — Progress Notes (Signed)
Bethanie Dicker, NP-C Phone: [phone]  Kristi Martinez is a 48 y.o. female who presents today to establish care and for annual exam.   Discussed the use of AI scribe software for clinical note transcription with the patient, who gave verbal consent to proceed.  History of Present Illness   The patient, with no significant past medical history, presents for a new patient visit and comprehensive physical examination. The patient's most recent encounter with healthcare was during a bout of COVID-19 pneumonia in 2021, which she managed at home.   The patient has a history of gestational diabetes with her last pregnancy and has noticed feeling sluggish after heavy meals. She is currently at her heaviest weight and is actively trying to lose weight. She has recently obtained a new insurance plan and is interested in getting lab work done to assess her overall health.  The patient reports having heavy, painful menstrual periods since the birth of her last child. She describes her periods as lasting up to 15 days and associated with severe cramping. She has tried IUDs in the past with no significant improvement in her symptoms. She is not currently on any form of birth control and is not sexually active. She has considered a hysterectomy but has not pursued it further.  The patient also reports difficulty sleeping, which she attributes to stress from various life events. She estimates she gets about four hours of sleep per night and has difficulty both falling asleep and staying asleep. She has not tried any over-the-counter sleep aids due to sensitivity to medications.  The patient is physically active, attending the gym three to four times a week. She reports her diet could be improved, and she struggles with portion control and carbohydrate intake. She does not smoke or use drugs and consumes alcohol infrequently. She has no known allergies and is not currently on any medications.      Active  Ambulatory Problems    Diagnosis Date Noted   Well woman exam with routine gynecological exam 03/24/2023   Morbid obesity with BMI of 40.0-44.9, adult (HCC) 03/24/2023   Menorrhagia with regular cycle 03/24/2023   Insomnia 03/24/2023   Resolved Ambulatory Problems    Diagnosis Date Noted   Malpositioned intrauterine device 11/19/2017   Past Medical History:  Diagnosis Date   Abortion in first trimester    Appendicitis 2002   Eczema    Herpes genitalis    Vitiligo     Family History  Problem Relation Age of Onset   Diabetes Mellitus II Mother    Hypertension Mother    Hypertension Father    Diabetes Sister        Gestational   Hodgkin's lymphoma Maternal Grandmother    Heart disease Paternal Grandmother        Had CABG   Hypertension Paternal Grandmother     Social History   Socioeconomic History   Marital status: Single    Spouse name: Not on file   Number of children: Not on file   Years of education: Not on file   Highest education level: Not on file  Occupational History   Not on file  Tobacco Use   Smoking status: Never   Smokeless tobacco: Never  Vaping Use   Vaping status: Never Used  Substance and Sexual Activity   Alcohol use: No   Drug use: No   Sexual activity: Not Currently    Birth control/protection: I.U.D.    Comment: Mirena  Other Topics Concern  Not on file  Social History Narrative   Not on file   Social Drivers of Health   Financial Resource Strain: Not on file  Food Insecurity: Not on file  Transportation Needs: Not on file  Physical Activity: Not on file  Stress: Not on file  Social Connections: Not on file  Intimate Partner Violence: Not on file    ROS  General:  Negative for unexplained weight loss, fever Skin: Negative for new or changing mole, sore that won't heal HEENT: Negative for trouble hearing, trouble seeing, ringing in ears, mouth sores, hoarseness, change in voice, dysphagia. CV:  Negative for chest pain,  dyspnea, edema, palpitations Resp: Negative for cough, dyspnea, hemoptysis GI: Negative for nausea, vomiting, diarrhea, constipation, abdominal pain, melena, hematochezia. GU: Negative for dysuria, incontinence, urinary hesitance, hematuria, vaginal or penile discharge, polyuria, sexual difficulty, lumps in testicle or breasts MSK: Negative for muscle cramps or aches, joint pain or swelling Neuro: Negative for headaches, weakness, numbness, dizziness, passing out/fainting Psych: Negative for depression, anxiety, memory problems  Objective  Physical Exam Vitals:   03/24/23 0821  BP: 118/82  Pulse: 75  Temp: 98.3 F (36.8 C)  SpO2: 97%    BP Readings from Last 3 Encounters:  03/24/23 118/82  11/22/19 114/76  08/16/19 120/80   Wt Readings from Last 3 Encounters:  03/24/23 205 lb 6.4 oz (93.2 kg)  08/16/19 215 lb (97.5 kg)  08/05/19 219 lb (99.3 kg)    Physical Exam Exam conducted with a chaperone present Donavan Foil, CMA).  Constitutional:      General: She is not in acute distress.    Appearance: Normal appearance. She is obese.  HENT:     Head: Normocephalic.     Right Ear: Tympanic membrane normal.     Left Ear: Tympanic membrane normal.     Nose: Nose normal.     Mouth/Throat:     Mouth: Mucous membranes are moist.     Pharynx: Oropharynx is clear.  Eyes:     Conjunctiva/sclera: Conjunctivae normal.     Pupils: Pupils are equal, round, and reactive to light.  Neck:     Thyroid: No thyromegaly.  Cardiovascular:     Rate and Rhythm: Normal rate and regular rhythm.     Heart sounds: Normal heart sounds.  Pulmonary:     Effort: Pulmonary effort is normal.     Breath sounds: Normal breath sounds.  Abdominal:     General: Abdomen is flat. Bowel sounds are normal.     Palpations: Abdomen is soft. There is no mass.     Tenderness: There is no abdominal tenderness.  Genitourinary:    General: Normal vulva.     Pubic Area: No rash.      Labia:        Right:  No rash or lesion.        Left: No rash or lesion.      Vagina: Normal.     Cervix: Normal.     Uterus: Normal.   Musculoskeletal:        General: Normal range of motion.  Lymphadenopathy:     Cervical: No cervical adenopathy.  Skin:    General: Skin is warm and dry.     Findings: No rash.  Neurological:     General: No focal deficit present.     Mental Status: She is alert.  Psychiatric:        Mood and Affect: Mood normal.  Behavior: Behavior normal.    Assessment/Plan:   Well woman exam with routine gynecological exam Assessment & Plan: Physical exam complete. We will check lab work as outlined and contact patient with the results. Last Pap smear was in 2019, last mammogram is unknown, and no previous colonoscopy. Perform Pap smear today. Schedule mammogram. Order Cologuard for colon cancer screening. Administer tetanus shot today. Declined flu vaccine. She has received one COVID vaccine and declines additional. Continue routine dental and eye exams. Encouraged to continue working on healthy diet and exercising regularly. Return to care in one year, sooner as needed.   Orders: -     Comprehensive metabolic panel -     VITAMIN D 25 Hydroxy (Vit-D Deficiency, Fractures)  Menorrhagia with regular cycle Assessment & Plan: Reports heavy, painful periods since last childbirth with no current birth control use and previous unsuccessful IUD use. Considering waiting until menopause for resolution. Continue monitoring symptoms and report any changes, consider referral to Ob-Gyn for further discussion. We will check lab work as outlined.  Orders: -     CBC with Differential/Platelet -     IBC + Ferritin  Morbid obesity with BMI of 40.0-44.9, adult [facility]) Assessment & Plan: History of gestational diabetes and a family history of diabetes, and reports feeling sluggish after heavy meals. Order A1c to screen for diabetes. Consider diet modifications, focusing on increased protein and  reduced carbohydrates. Encouraged to continue exercising regularly.   Orders: -     Hemoglobin A1c  Insomnia, unspecified type Assessment & Plan: Experiences difficulty falling and staying asleep, possibly due to stress, and is sensitive to sedatives. Averages approx 4 hours per night. Try over-the-counter melatonin or Unisom for sleep aid.    Need for Tdap vaccination -     Tdap vaccine greater than or equal to 7yo IM  Screen for colon cancer -     Cologuard  Screening for cervical cancer -     Cytology - PAP  Screening mammogram for breast cancer -     3D Screening Mammogram, Left and Right; Future  Thyroid disorder screen -     TSH  Lipid screening -     Lipid panel    Return in about 1 year (around 03/23/2024) for Annual Exam, sooner as needed.   Bethanie Dicker, NP-C [facility]

## 2023-03-24 NOTE — Assessment & Plan Note (Signed)
Experiences difficulty falling and staying asleep, possibly due to stress, and is sensitive to sedatives. Averages approx 4 hours per night. Try over-the-counter melatonin or Unisom for sleep aid.

## 2023-03-24 NOTE — Assessment & Plan Note (Signed)
Reports heavy, painful periods since last childbirth with no current birth control use and previous unsuccessful IUD use. Considering waiting until menopause for resolution. Continue monitoring symptoms and report any changes, consider referral to Ob-Gyn for further discussion. We will check lab work as outlined.

## 2023-03-25 ENCOUNTER — Other Ambulatory Visit: Payer: Self-pay

## 2023-03-25 ENCOUNTER — Telehealth: Payer: Self-pay

## 2023-03-25 DIAGNOSIS — D649 Anemia, unspecified: Secondary | ICD-10-CM

## 2023-03-25 NOTE — Telephone Encounter (Signed)
Called pt back and read results documented under lab tab

## 2023-03-25 NOTE — Telephone Encounter (Signed)
Copied from CRM [id number]. Topic: Clinical - Lab/Test Results >> Mar 25, 2023  9:21 AM Josefa Half C wrote: Reason for CRM: Patient returned a call from [facility](CMA) regarding lab test results. Please follow up with patient [phone]

## 2023-03-25 NOTE — Telephone Encounter (Signed)
2nd attempt to contact pt phone went straight to vm did not leave a 2nd vm mychart msg sent.

## 2023-03-26 ENCOUNTER — Other Ambulatory Visit: Payer: Self-pay

## 2023-03-26 ENCOUNTER — Other Ambulatory Visit: Payer: Self-pay | Admitting: Nurse Practitioner

## 2023-03-26 DIAGNOSIS — D509 Iron deficiency anemia, unspecified: Secondary | ICD-10-CM

## 2023-03-26 DIAGNOSIS — E559 Vitamin D deficiency, unspecified: Secondary | ICD-10-CM

## 2023-03-26 DIAGNOSIS — E119 Type 2 diabetes mellitus without complications: Secondary | ICD-10-CM

## 2023-03-26 MED ORDER — METFORMIN HCL ER 500 MG PO TB24: 500.0000 mg | ORAL_TABLET | Freq: Two times a day (BID) | ORAL | 1 refills | Status: DC

## 2023-03-26 MED ORDER — VITAMIN D (ERGOCALCIFEROL) 1.25 MG (50000 UNIT) PO CAPS: 50000.0000 [IU] | ORAL_CAPSULE | ORAL | 1 refills | Status: AC

## 2023-03-26 MED ORDER — IRON (FERROUS SULFATE) 325 (65 FE) MG PO TABS: 1.0000 | ORAL_TABLET | Freq: Every day | ORAL | 1 refills | Status: AC

## 2023-03-28 ENCOUNTER — Other Ambulatory Visit (INDEPENDENT_AMBULATORY_CARE_PROVIDER_SITE_OTHER): Payer: BC Managed Care – PPO

## 2023-03-28 DIAGNOSIS — D509 Iron deficiency anemia, unspecified: Secondary | ICD-10-CM | POA: Diagnosis not present

## 2023-03-28 LAB — CBC WITH DIFFERENTIAL/PLATELET
Absolute Lymphocytes: 2662 {cells}/uL (ref 850–3900)
Absolute Monocytes: 462 {cells}/uL (ref 200–950)
Basophils Absolute: 55 {cells}/uL (ref 0–200)
Basophils Relative: 0.5 %
Eosinophils Absolute: 264 {cells}/uL (ref 15–500)
Eosinophils Relative: 2.4 %
HCT: 32.7 % — ABNORMAL LOW (ref 35.0–45.0)
Hemoglobin: 9.2 g/dL — ABNORMAL LOW (ref 11.7–15.5)
MCH: 18.7 pg — ABNORMAL LOW (ref 27.0–33.0)
MCHC: 28.1 g/dL — ABNORMAL LOW (ref 32.0–36.0)
MCV: 66.6 fL — ABNORMAL LOW (ref 80.0–100.0)
MPV: 9.4 fL (ref 7.5–12.5)
Monocytes Relative: 4.2 %
Neutro Abs: 7557 {cells}/uL (ref 1500–7800)
Neutrophils Relative %: 68.7 %
Platelets: 631 10*3/uL — ABNORMAL HIGH (ref 140–400)
RBC: 4.91 10*6/uL (ref 3.80–5.10)
RDW: 19.5 % — ABNORMAL HIGH (ref 11.0–15.0)
Total Lymphocyte: 24.2 %
WBC: 11 10*3/uL — ABNORMAL HIGH (ref 3.8–10.8)

## 2023-03-28 LAB — CBC MORPHOLOGY

## 2023-03-28 NOTE — Addendum Note (Signed)
Addended by: Jarvis Morgan D on: 03/28/2023 03:29 PM   Modules accepted: Orders

## 2023-04-04 ENCOUNTER — Inpatient Hospital Stay: Payer: BC Managed Care – PPO | Attending: Oncology | Admitting: Oncology

## 2023-04-04 ENCOUNTER — Encounter: Payer: Self-pay | Admitting: Oncology

## 2023-04-04 ENCOUNTER — Inpatient Hospital Stay: Payer: BC Managed Care – PPO

## 2023-04-04 VITALS — BP 102/54 | HR 87 | Temp 96.0°F | Resp 18 | Wt 205.5 lb

## 2023-04-04 DIAGNOSIS — N920 Excessive and frequent menstruation with regular cycle: Secondary | ICD-10-CM | POA: Insufficient documentation

## 2023-04-04 DIAGNOSIS — D509 Iron deficiency anemia, unspecified: Secondary | ICD-10-CM | POA: Diagnosis present

## 2023-04-04 NOTE — Progress Notes (Signed)
Hematology/Oncology Consult note [facility] Telephone:([phone] [phone]  Patient Care Team: Bethanie Dicker, NP as PCP - General (Nurse Practitioner)   Name of the patient: Kristi Martinez  [id number]  [birth year]    Reason for referral-iron deficiency anemia   Referring physician- Bethanie Dicker, NP  Date of visit: 04/04/23   History of presenting illness- Patient is a 48 year old female with no significant past medical history referred for iron deficiency anemia.  CBC from 03/28/2023 showed white count of 11, H&H of 9.2/32.7 with an MCV of 66.6 and platelet count of 631.  Ferritin levels were low at 3.7 with an iron saturation of 4%.  She has never had an EGD or colonoscopy in the past.  She will be getting her Cologuard testing done soon.  Her menstrual cycles are heavy and can last for 7 to 10 days.  She often requires to change her tampons and sanitary pads every 2 hours.  She has followed up with GYN in the past but has not seen them recently.  Currently reports ongoing fatigue  ECOG PS- 0  Pain scale- 0   Review of systems- Review of Systems  Constitutional:  Positive for malaise/fatigue. Negative for chills, fever and weight loss.  HENT:  Negative for congestion, ear discharge and nosebleeds.   Eyes:  Negative for blurred vision.  Respiratory:  Negative for cough, hemoptysis, sputum production, shortness of breath and wheezing.   Cardiovascular:  Negative for chest pain, palpitations, orthopnea and claudication.  Gastrointestinal:  Negative for abdominal pain, blood in stool, constipation, diarrhea, heartburn, melena, nausea and vomiting.  Genitourinary:  Negative for dysuria, flank pain, frequency, hematuria and urgency.  Musculoskeletal:  Negative for back pain, joint pain and myalgias.  Skin:  Negative for rash.  Neurological:  Negative for dizziness, tingling, focal weakness, seizures, weakness and headaches.  Endo/Heme/Allergies:  Does  not bruise/bleed easily.  Psychiatric/Behavioral:  Negative for depression and suicidal ideas. The patient does not have insomnia.     No Known Allergies  Patient Active Problem List   Diagnosis Date Noted   Well woman exam with routine gynecological exam 03/24/2023   Morbid obesity with BMI of 40.0-44.9, adult (HCC) 03/24/2023   Menorrhagia with regular cycle 03/24/2023   Insomnia 03/24/2023     Past Medical History:  Diagnosis Date   Abortion in first trimester    Appendicitis 2002   Arthritis    Diabetes mellitus without complication (HCC)    Eczema    Herpes genitalis    Vitiligo      Past Surgical History:  Procedure Laterality Date   APPENDECTOMY     CESAREAN SECTION     x2   CHOLECYSTECTOMY      Social History   Socioeconomic History   Marital status: Single    Spouse name: Not on file   Number of children: Not on file   Years of education: Not on file   Highest education level: Not on file  Occupational History   Not on file  Tobacco Use   Smoking status: Never   Smokeless tobacco: Never  Vaping Use   Vaping status: Never Used  Substance and Sexual Activity   Alcohol use: No    Comment: Social Drinker   Drug use: No   Sexual activity: Not Currently    Birth control/protection: I.U.D.    Comment: Mirena  Other Topics Concern   Not on file  Social History Narrative   Not on file   Social  Drivers of Corporate investment banker Strain: Not on file  Food Insecurity: Not on file  Transportation Needs: Not on file  Physical Activity: Not on file  Stress: Not on file  Social Connections: Not on file  Intimate Partner Violence: Not on file     Family History  Problem Relation Age of Onset   Diabetes Mellitus II Mother    Hypertension Mother    Hypertension Father    Diabetes Sister        Gestational   Hodgkin's lymphoma Maternal Grandmother    Heart disease Paternal Grandmother        Had CABG   Hypertension Paternal Grandmother       Current Outpatient Medications:    cetirizine (ZYRTEC) 10 MG tablet, Take 10 mg by mouth daily., Disp: , Rfl:    Iron, Ferrous Sulfate, 325 (65 Fe) MG TABS, Take 1 tablet by mouth daily., Disp: 90 tablet, Rfl: 1   meloxicam (MOBIC) 15 MG tablet, Take 1 tablet by mouth daily. (Patient not taking: Reported on 04/04/2023), Disp: , Rfl:    metFORMIN (GLUCOPHAGE-XR) 500 MG 24 hr tablet, Take 1 tablet (500 mg total) by mouth 2 (two) times daily with a meal., Disp: 180 tablet, Rfl: 1   Vitamin D, Ergocalciferol, (DRISDOL) 1.25 MG (50000 UNIT) CAPS capsule, Take 1 capsule (50,000 Units total) by mouth every 7 (seven) days., Disp: 13 capsule, Rfl: 1   Physical exam: There were no vitals filed for this visit. Physical Exam Cardiovascular:     Rate and Rhythm: Normal rate and regular rhythm.     Heart sounds: Normal heart sounds.  Pulmonary:     Effort: Pulmonary effort is normal.     Breath sounds: Normal breath sounds.  Abdominal:     General: Bowel sounds are normal.     Palpations: Abdomen is soft.  Skin:    General: Skin is warm and dry.  Neurological:     Mental Status: She is alert and oriented to person, place, and time.           Latest Ref Rng & Units 03/24/2023    9:09 AM  CMP  Glucose 70 - 99 mg/dL 562   BUN 6 - 23 mg/dL 8   Creatinine 1.30 - 8.65 mg/dL 7.84   Sodium 696 - 295 mEq/L 139   Potassium 3.5 - 5.1 mEq/L 4.0   Chloride 96 - 112 mEq/L 100   CO2 19 - 32 mEq/L 28   Calcium 8.4 - 10.5 mg/dL 9.1   Total Protein 6.0 - 8.3 g/dL 7.9   Total Bilirubin 0.2 - 1.2 mg/dL 0.3   Alkaline Phos 39 - 117 U/L 89   AST 0 - 37 U/L 20   ALT 0 - 35 U/L 22       Latest Ref Rng & Units 03/28/2023    3:29 PM  CBC  WBC 3.8 - 10.8 Thousand/uL 11.0   Hemoglobin 11.7 - 15.5 g/dL 9.2   Hematocrit 28.4 - 45.0 % 32.7   Platelets 140 - 400 Thousand/uL 631     No images are attached to the encounter.  No results found.  Assessment and plan- Patient is a 48 y.o. female  referred for iron deficiency anemia  Iron deficiency anemia likely secondary to menorrhagia.  Suspect thrombocytosis is reactive secondary to iron deficiency.  We discussed both oral and IV iron.  Patient will continue to take oral iron every day or every other day.  But we  will also proceed with IV iron at this time.  As per insurance requirements we will proceed with 5 mg of Venofer at this time.  Discussed risks and benefits of Venofer including all but not limited to possible risk of infusion reaction.  Patient understands and agrees to proceed as planned.  Repeat CBC ferritin and iron studies B12 folate celiac disease panel in 2 months and see me.  We did discuss consideration for GI referral given that she has never had a colonoscopy.  She is proceeding with Cologuard at this time and I discussed potential downsides of Cologuard which is a good test to detect colon cancer but is not very sensitive in detecting precancerous polyps.  She would like to keep GI referral on hold for now but she is agreeable to seeing GYN to manage her menorrhagia.   Thank you for this kind referral and the opportunity to participate in the care of this  Patient   Visit Diagnosis 1. Iron deficiency anemia, unspecified iron deficiency anemia type     Dr. Owens Shark, MD, MPH [facility] at [facility] [phone] 04/04/2023

## 2023-04-07 ENCOUNTER — Inpatient Hospital Stay: Payer: BC Managed Care – PPO

## 2023-04-07 LAB — CYTOLOGY - PAP
Adequacy: ABSENT
Chlamydia: NEGATIVE
Comment: NEGATIVE
Comment: NEGATIVE
Comment: NEGATIVE
Comment: NEGATIVE
Comment: NEGATIVE
Comment: NORMAL
Diagnosis: NEGATIVE
HPV 16: NEGATIVE
HPV 18 / 45: NEGATIVE
High risk HPV: POSITIVE — AB
Neisseria Gonorrhea: NEGATIVE
Trichomonas: NEGATIVE

## 2023-04-08 ENCOUNTER — Telehealth: Payer: Self-pay

## 2023-04-08 NOTE — Telephone Encounter (Signed)
VM left to CB for pap smear results

## 2023-04-09 ENCOUNTER — Inpatient Hospital Stay: Payer: BC Managed Care – PPO | Attending: Oncology

## 2023-04-09 ENCOUNTER — Inpatient Hospital Stay: Payer: BC Managed Care – PPO

## 2023-04-09 VITALS — BP 111/76 | HR 70 | Temp 97.3°F | Resp 18

## 2023-04-09 DIAGNOSIS — D509 Iron deficiency anemia, unspecified: Secondary | ICD-10-CM | POA: Insufficient documentation

## 2023-04-09 MED ORDER — SODIUM CHLORIDE 0.9% FLUSH
10.0000 mL | Freq: Once | INTRAVENOUS | Status: AC | PRN
  Administered 2023-04-09: 10 mL
  Filled 2023-04-09: qty 10

## 2023-04-09 MED ORDER — IRON SUCROSE 20 MG/ML IV SOLN
200.0000 mg | INTRAVENOUS | Status: DC
Start: 2023-04-09 — End: 2023-04-09
  Administered 2023-04-09: 200 mg via INTRAVENOUS
  Filled 2023-04-09: qty 10

## 2023-04-11 ENCOUNTER — Ambulatory Visit: Payer: BC Managed Care – PPO | Admitting: Nurse Practitioner

## 2023-04-11 ENCOUNTER — Inpatient Hospital Stay: Payer: BC Managed Care – PPO

## 2023-04-11 VITALS — BP 115/73 | HR 89 | Temp 98.8°F | Resp 18

## 2023-04-11 DIAGNOSIS — D509 Iron deficiency anemia, unspecified: Secondary | ICD-10-CM

## 2023-04-11 MED ORDER — SODIUM CHLORIDE 0.9% FLUSH
10.0000 mL | Freq: Once | INTRAVENOUS | Status: AC | PRN
  Administered 2023-04-11: 10 mL
  Filled 2023-04-11: qty 10

## 2023-04-11 MED ORDER — IRON SUCROSE 20 MG/ML IV SOLN
200.0000 mg | INTRAVENOUS | Status: DC
  Administered 2023-04-11: 200 mg via INTRAVENOUS
  Filled 2023-04-11: qty 10

## 2023-04-14 ENCOUNTER — Inpatient Hospital Stay: Payer: BC Managed Care – PPO

## 2023-04-14 VITALS — BP 123/69 | HR 74 | Temp 95.1°F | Resp 18

## 2023-04-14 DIAGNOSIS — D509 Iron deficiency anemia, unspecified: Secondary | ICD-10-CM

## 2023-04-14 MED ORDER — IRON SUCROSE 20 MG/ML IV SOLN
200.0000 mg | INTRAVENOUS | Status: DC
  Administered 2023-04-14: 200 mg via INTRAVENOUS

## 2023-04-14 MED ORDER — SODIUM CHLORIDE 0.9% FLUSH
10.0000 mL | Freq: Once | INTRAVENOUS | Status: AC | PRN
  Administered 2023-04-14: 10 mL
  Filled 2023-04-14: qty 10

## 2023-04-15 ENCOUNTER — Encounter: Payer: Self-pay | Admitting: Nurse Practitioner

## 2023-04-16 ENCOUNTER — Inpatient Hospital Stay: Payer: BC Managed Care – PPO

## 2023-04-16 VITALS — BP 117/70 | HR 70

## 2023-04-16 DIAGNOSIS — D509 Iron deficiency anemia, unspecified: Secondary | ICD-10-CM

## 2023-04-16 MED ORDER — IRON SUCROSE 20 MG/ML IV SOLN
200.0000 mg | INTRAVENOUS | Status: DC
  Administered 2023-04-16: 200 mg via INTRAVENOUS

## 2023-04-16 MED ORDER — SODIUM CHLORIDE 0.9% FLUSH
10.0000 mL | Freq: Once | INTRAVENOUS | Status: AC | PRN
  Administered 2023-04-16: 10 mL
  Filled 2023-04-16: qty 10

## 2023-04-17 ENCOUNTER — Ambulatory Visit
Admission: RE | Admit: 2023-04-17 | Discharge: 2023-04-17 | Disposition: A | Discharge status: Home / Self Care | Payer: BC Managed Care – PPO | Source: Ambulatory Visit | Attending: Nurse Practitioner | Admitting: Nurse Practitioner

## 2023-04-17 DIAGNOSIS — Z1231 Encounter for screening mammogram for malignant neoplasm of breast: Secondary | ICD-10-CM | POA: Insufficient documentation

## 2023-04-18 ENCOUNTER — Inpatient Hospital Stay
Admission: RE | Admit: 2023-04-18 | Discharge: 2023-04-18 | Disposition: A | Discharge status: Home / Self Care | Payer: Self-pay | Source: Ambulatory Visit | Attending: Nurse Practitioner | Admitting: Nurse Practitioner

## 2023-04-18 ENCOUNTER — Other Ambulatory Visit: Payer: Self-pay | Admitting: *Deleted

## 2023-04-18 DIAGNOSIS — Z1231 Encounter for screening mammogram for malignant neoplasm of breast: Secondary | ICD-10-CM

## 2023-04-21 ENCOUNTER — Inpatient Hospital Stay: Payer: BC Managed Care – PPO

## 2023-04-22 ENCOUNTER — Encounter: Payer: Self-pay | Admitting: Nurse Practitioner

## 2023-04-28 ENCOUNTER — Inpatient Hospital Stay: Payer: BC Managed Care – PPO

## 2023-04-28 VITALS — BP 123/76 | HR 87 | Temp 97.6°F | Resp 18

## 2023-04-28 DIAGNOSIS — D509 Iron deficiency anemia, unspecified: Secondary | ICD-10-CM | POA: Diagnosis not present

## 2023-04-28 MED ORDER — IRON SUCROSE 20 MG/ML IV SOLN
200.0000 mg | INTRAVENOUS | Status: DC
  Administered 2023-04-28: 200 mg via INTRAVENOUS

## 2023-04-28 MED ORDER — SODIUM CHLORIDE 0.9% FLUSH
10.0000 mL | Freq: Once | INTRAVENOUS | Status: AC | PRN
  Administered 2023-04-28: 10 mL
  Filled 2023-04-28: qty 10

## 2023-04-29 LAB — COLOGUARD: COLOGUARD: NEGATIVE

## 2023-04-30 ENCOUNTER — Encounter: Payer: Self-pay | Admitting: Nurse Practitioner

## 2023-04-30 ENCOUNTER — Ambulatory Visit: Payer: BC Managed Care – PPO | Admitting: Nurse Practitioner

## 2023-04-30 VITALS — BP 116/70 | HR 94 | Temp 98.3°F | Ht 60.0 in | Wt 204.2 lb

## 2023-04-30 DIAGNOSIS — E119 Type 2 diabetes mellitus without complications: Secondary | ICD-10-CM | POA: Diagnosis not present

## 2023-04-30 DIAGNOSIS — D509 Iron deficiency anemia, unspecified: Secondary | ICD-10-CM

## 2023-04-30 DIAGNOSIS — Z6841 Body Mass Index (BMI) 40.0 and over, adult: Secondary | ICD-10-CM

## 2023-04-30 MED ORDER — TIRZEPATIDE 2.5 MG/0.5ML ~~LOC~~ SOAJ: 2.5000 mg | SUBCUTANEOUS | 0 refills | Status: DC

## 2023-04-30 MED ORDER — TIRZEPATIDE 5 MG/0.5ML ~~LOC~~ SOAJ: 5.0000 mg | SUBCUTANEOUS | 1 refills | Status: DC

## 2023-04-30 NOTE — Assessment & Plan Note (Signed)
 Under the care of Hematology and Oncology, with a current regimen of daily oral iron supplements. Continue this regimen and follow up with Hematology and Oncology as directed.

## 2023-04-30 NOTE — Assessment & Plan Note (Signed)
 Diagnosed with Type 2 Diabetes last month. Starting on [facility], anticipate weight loss. See diabetes plan. Encouraged healthy diet and exercise.

## 2023-04-30 NOTE — Assessment & Plan Note (Signed)
 A1c is 8.0, indicating poor glycemic control. There is difficulty adhering to the twice-daily metformin regimen due to a busy schedule, though no gastrointestinal side effects or symptoms of hyperglycemia are reported. Discontinue metformin and start Mounjaro. Will start the patient on Mounjaro. Counseled on the risk of pancreatitis and gallbladder disease. Discussed the risk of nausea. Advised to discontinue the [facility] and contact us immediately if they develop abdominal pain. If they develop excessive nausea they will contact us right away. I discussed that medullary thyroid cancer has been seen in rats studies. The patient confirmed no personal or family history of thyroid cancer, parathyroid cancer, or adrenal gland cancer. Discussed that we thus far have not seen medullary thyroid cancer result from use of this type of medication in humans. Advised to monitor the thyroid area and contact us for any lumps, swelling, trouble swallowing, or any other changes in this area.  Discussed goal weight loss of 1 to 2 pounds a week while on this medication. Discussed the importance of healthy diet, exercise and lifestyle modifications even with this medication. Encourage continued focus on diet and exercise, specifically reducing carbohydrate intake. She will follow up in 2 months to recheck A1c and assess effectiveness and tolerance of medication.

## 2023-04-30 NOTE — Progress Notes (Signed)
 Kristi Dicker, NP-C Phone: [phone]  Kristi Martinez is a 48 y.o. female who presents today for medication change.   Discussed the use of AI scribe software for clinical note transcription with the patient, who gave verbal consent to proceed.  History of Present Illness   Kristi Martinez is a 48 year old female with type 2 diabetes who presents with difficulty managing her medication regimen.  She is experiencing difficulty managing her diabetes medication regimen, specifically missing doses of metformin, which is prescribed twice daily. Her busy schedule, involving basketball and baseball activities five days a week, contributes to her challenges in adhering to the medication schedule. Despite setting alarms, she struggles to remember to take her medication. She is not currently checking her blood sugar at home. No gastrointestinal side effects from metformin are noted, although she associates previous gastrointestinal issues with iron pills.  Her diabetes was diagnosed last month with an A1c of 8.0%. She is attempting to focus on diet and exercise, particularly reducing carbohydrate intake, as part of her diabetes management. She mentions trying to reduce dessert consumption and opting for healthier meal choices, such as steak and broccoli instead of loaded fries, and salads instead of fast food. She is aware of the need to moderate her intake of bread and pasta.  She has a history of anemia, which is being managed by hematology and oncology. She underwent five IV iron infusions and continues to take oral iron supplements daily, although she was advised she could take them every other day. She attributes her previous excessive urination to consuming too much ice, which was related to her anemia. No excessive thirst or urination is currently experienced.  She is out of the house five days a week for basketball and baseball activities. She has two sons, one in college and one involved in  travel baseball. She has no history of thyroid cancer and no family history of thyroid cancer.      Social History   Tobacco Use  Smoking Status Never  Smokeless Tobacco Never    Current Outpatient Medications on File Prior to Visit  Medication Sig Dispense Refill   cetirizine (ZYRTEC) 10 MG tablet Take 10 mg by mouth daily.     Iron, Ferrous Sulfate, 325 (65 Fe) MG TABS Take 1 tablet by mouth daily. 90 tablet 1   meloxicam (MOBIC) 15 MG tablet Take 1 tablet by mouth daily. (Patient not taking: Reported on 04/04/2023)     Vitamin D, Ergocalciferol, (DRISDOL) 1.25 MG (50000 UNIT) CAPS capsule Take 1 capsule (50,000 Units total) by mouth every 7 (seven) days. 13 capsule 1   No current facility-administered medications on file prior to visit.    ROS see history of present illness  Objective  Physical Exam Vitals:   04/30/23 1457  BP: 116/70  Pulse: 94  Temp: 98.3 F (36.8 C)  SpO2: 96%    BP Readings from Last 3 Encounters:  04/30/23 116/70  04/28/23 123/76  04/16/23 117/70   Wt Readings from Last 3 Encounters:  04/30/23 204 lb 3.2 oz (92.6 kg)  04/04/23 205 lb 8 oz (93.2 kg)  03/24/23 205 lb 6.4 oz (93.2 kg)    Physical Exam Constitutional:      General: She is not in acute distress.    Appearance: Normal appearance.  HENT:     Head: Normocephalic.  Cardiovascular:     Rate and Rhythm: Normal rate and regular rhythm.     Heart sounds: Normal heart sounds.  Pulmonary:     Effort: Pulmonary effort is normal.     Breath sounds: Normal breath sounds.  Skin:    General: Skin is warm and dry.  Neurological:     General: No focal deficit present.     Mental Status: She is alert.  Psychiatric:        Mood and Affect: Mood normal.        Behavior: Behavior normal.     Assessment/Plan: Please see individual problem list.  Type 2 diabetes mellitus without complication, without long-term current use of insulin (HCC) Assessment & Plan: A1c is 8.0, indicating  poor glycemic control. There is difficulty adhering to the twice-daily metformin regimen due to a busy schedule, though no gastrointestinal side effects or symptoms of hyperglycemia are reported. Discontinue metformin and start Mounjaro. Will start the patient on Mounjaro. Counseled on the risk of pancreatitis and gallbladder disease. Discussed the risk of nausea. Advised to discontinue the [facility] and contact us immediately if they develop abdominal pain. If they develop excessive nausea they will contact us right away. I discussed that medullary thyroid cancer has been seen in rats studies. The patient confirmed no personal or family history of thyroid cancer, parathyroid cancer, or adrenal gland cancer. Discussed that we thus far have not seen medullary thyroid cancer result from use of this type of medication in humans. Advised to monitor the thyroid area and contact us for any lumps, swelling, trouble swallowing, or any other changes in this area.  Discussed goal weight loss of 1 to 2 pounds a week while on this medication. Discussed the importance of healthy diet, exercise and lifestyle modifications even with this medication. Encourage continued focus on diet and exercise, specifically reducing carbohydrate intake. She will follow up in 2 months to recheck A1c and assess effectiveness and tolerance of medication.   Orders: -     Tirzepatide; Inject 2.5 mg into the skin once a week.  Dispense: 2 mL; Refill: 0 -     Tirzepatide; Inject 5 mg into the skin once a week.  Dispense: 2 mL; Refill: 1  Iron deficiency anemia, unspecified iron deficiency anemia type Assessment & Plan: Under the care of Hematology and Oncology, with a current regimen of daily oral iron supplements. Continue this regimen and follow up with Hematology and Oncology as directed.   Morbid obesity with BMI of 40.0-44.9, adult [facility]) Assessment & Plan: Diagnosed with Type 2 Diabetes last month. Starting on [facility], anticipate  weight loss. See diabetes plan. Encouraged healthy diet and exercise.     Return in 8 weeks (on 06/24/2023) for Follow up as scheduled.   Kristi Dicker, NP-C [facility]

## 2023-05-02 ENCOUNTER — Other Ambulatory Visit (HOSPITAL_COMMUNITY): Payer: Self-pay

## 2023-05-05 ENCOUNTER — Encounter: Payer: Self-pay | Admitting: Oncology

## 2023-05-05 ENCOUNTER — Other Ambulatory Visit (HOSPITAL_COMMUNITY): Payer: Self-pay

## 2023-05-07 ENCOUNTER — Other Ambulatory Visit (HOSPITAL_COMMUNITY): Payer: Self-pay

## 2023-05-12 ENCOUNTER — Telehealth: Payer: Self-pay

## 2023-05-12 NOTE — Telephone Encounter (Signed)
 Copied from CRM [id number]. Topic: General - Other >> May 12, 2023  2:19 PM Turkey A wrote: Reason for CRM: Patient called to let the office know the Insurance was faxing over today a Prior Authorization for  tirzepatide [facility]) 2.5 MG/0.5ML Pen

## 2023-05-13 ENCOUNTER — Telehealth: Payer: Self-pay

## 2023-05-13 ENCOUNTER — Encounter: Payer: Self-pay | Admitting: Oncology

## 2023-05-13 ENCOUNTER — Other Ambulatory Visit (HOSPITAL_COMMUNITY): Payer: Self-pay

## 2023-05-13 NOTE — Telephone Encounter (Signed)
 Pharmacy Patient Advocate Encounter   Received notification from Physician's Office that prior authorization for Mounjaro 2.5  is required/requested.   Insurance verification completed.   The patient is insured through [place] .   Per test claim:  Prior Auth required and submitted [id number]

## 2023-05-13 NOTE — Telephone Encounter (Signed)
 Pharmacy Patient Advocate Encounter  Received notification from [facility] that Prior Authorization for [facility] 2.5MG  has been APPROVED from 05/13/23 to 05/12/24. Ran test claim, Copay is $25.00. This test claim was processed through [facility]- copay amounts may vary at other pharmacies due to pharmacy/plan contracts, or as the patient moves through the different stages of their insurance plan.   PA #/Case ID/Reference #: [id number]

## 2023-06-03 ENCOUNTER — Other Ambulatory Visit: Payer: Self-pay | Admitting: Nurse Practitioner

## 2023-06-06 ENCOUNTER — Inpatient Hospital Stay (HOSPITAL_BASED_OUTPATIENT_CLINIC_OR_DEPARTMENT_OTHER): Payer: BC Managed Care – PPO | Admitting: Oncology

## 2023-06-06 ENCOUNTER — Encounter: Payer: Self-pay | Admitting: Oncology

## 2023-06-06 ENCOUNTER — Inpatient Hospital Stay: Payer: BC Managed Care – PPO | Attending: Oncology

## 2023-06-06 VITALS — BP 89/58 | HR 87 | Temp 96.9°F | Resp 19 | Ht 60.0 in | Wt 205.5 lb

## 2023-06-06 DIAGNOSIS — D509 Iron deficiency anemia, unspecified: Secondary | ICD-10-CM | POA: Diagnosis present

## 2023-06-06 LAB — CBC (CANCER CENTER ONLY)
HCT: 34.1 % — ABNORMAL LOW (ref 36.0–46.0)
Hemoglobin: 10.6 g/dL — ABNORMAL LOW (ref 12.0–15.0)
MCH: 26 pg (ref 26.0–34.0)
MCHC: 31.1 g/dL (ref 30.0–36.0)
MCV: 83.8 fL (ref 80.0–100.0)
Platelet Count: 471 10*3/uL — ABNORMAL HIGH (ref 150–400)
RBC: 4.07 MIL/uL (ref 3.87–5.11)
RDW: 24 % — ABNORMAL HIGH (ref 11.5–15.5)
WBC Count: 10.2 10*3/uL (ref 4.0–10.5)
nRBC: 0 % (ref 0.0–0.2)

## 2023-06-06 LAB — IRON AND TIBC
Iron: 38 ug/dL (ref 28–170)
Saturation Ratios: 12 % (ref 10.4–31.8)
TIBC: 332 ug/dL (ref 250–450)
UIBC: 294 ug/dL

## 2023-06-06 LAB — FERRITIN: Ferritin: 48 ng/mL (ref 11–307)

## 2023-06-06 LAB — FOLATE: Folate: 18.7 ng/mL (ref >5.9)

## 2023-06-06 LAB — VITAMIN B12: Vitamin B-12: 333 pg/mL (ref 180–914)

## 2023-06-06 NOTE — Progress Notes (Signed)
 Hematology/Oncology Consult note [facility]  Telephone:([phone] [phone]  Patient Care Team: Bethanie Dicker, NP as PCP - General (Nurse Practitioner)   Name of the patient: Kristi Martinez  [id number]  1975-06-02   Date of visit: 06/06/23  Diagnosis-iron deficiency anemia  Chief complaint/ Reason for visit-routine follow-up of iron deficiency anemia  Heme/Onc history: Patient is a 48 year old female with no significant past medical history referred for iron deficiency anemia.  CBC from 03/28/2023 showed white count of 11, H&H of 9.2/32.7 with an MCV of 66.6 and platelet count of 631.  Ferritin levels were low at 3.7 with an iron saturation of 4%.  She has never had an EGD or colonoscopy in the past.  She will be getting her Cologuard testing done soon.  Her menstrual cycles are heavy and can last for 7 to 10 days.  She often requires to change her tampons and sanitary pads every 2 hours.  She has followed up with GYN in the past but has not seen them recently.     Interval history-craving for ice is improved after receiving IV iron.  She did not have any menstrual cycles since November and then had a heavy cycle in March 2025.  ECOG PS- 0 Pain scale- 0   Review of systems- Review of Systems  Constitutional:  Negative for chills, fever, malaise/fatigue and weight loss.  HENT:  Negative for congestion, ear discharge and nosebleeds.   Eyes:  Negative for blurred vision.  Respiratory:  Negative for cough, hemoptysis, sputum production, shortness of breath and wheezing.   Cardiovascular:  Negative for chest pain, palpitations, orthopnea and claudication.  Gastrointestinal:  Negative for abdominal pain, blood in stool, constipation, diarrhea, heartburn, melena, nausea and vomiting.  Genitourinary:  Negative for dysuria, flank pain, frequency, hematuria and urgency.  Musculoskeletal:  Negative for back pain, joint pain and myalgias.  Skin:  Negative  for rash.  Neurological:  Negative for dizziness, tingling, focal weakness, seizures, weakness and headaches.  Endo/Heme/Allergies:  Does not bruise/bleed easily.  Psychiatric/Behavioral:  Negative for depression and suicidal ideas. The patient does not have insomnia.       No Known Allergies   Past Medical History:  Diagnosis Date   Abortion in first trimester    Appendicitis 2002   Arthritis    Diabetes mellitus without complication (HCC)    Eczema    Herpes genitalis    Vitiligo      Past Surgical History:  Procedure Laterality Date   APPENDECTOMY     CESAREAN SECTION     x2   CHOLECYSTECTOMY      Social History   Socioeconomic History   Marital status: Single    Spouse name: Not on file   Number of children: Not on file   Years of education: Not on file   Highest education level: Not on file  Occupational History   Not on file  Tobacco Use   Smoking status: Never   Smokeless tobacco: Never  Vaping Use   Vaping status: Never Used  Substance and Sexual Activity   Alcohol use: No    Comment: Social Drinker   Drug use: No   Sexual activity: Not Currently    Birth control/protection: I.U.D.    Comment: Mirena  Other Topics Concern   Not on file  Social History Narrative   Patient has 2 boys whom she loves dearly! Oldest son is in college at [employer] as a Printmaker, scheduled to graduate in  2028! Her youngest son is in 5th grade and loves to play basketball/baseball!   Social Drivers of Corporate investment banker Strain: Not on file  Food Insecurity: No Food Insecurity (04/04/2023)   Hunger Vital Sign    Worried About Running Out of Food in the Last Year: Never true    Ran Out of Food in the Last Year: Never true  Transportation Needs: No Transportation Needs (04/04/2023)   PRAPARE - Administrator, Civil Service (Medical): No    Lack of Transportation (Non-Medical): No  Physical Activity: Not on file  Stress: Not on file  Social Connections:  Not on file  Intimate Partner Violence: Not At Risk (04/04/2023)   Humiliation, Afraid, Rape, and Kick questionnaire    Fear of Current or Ex-Partner: No    Emotionally Abused: No    Physically Abused: No    Sexually Abused: No    Family History  Problem Relation Age of Onset   Diabetes Mellitus II Mother    Hypertension Mother    Hypertension Father    Diabetes Sister        Gestational   Hodgkin's lymphoma Maternal Grandmother    Heart disease Paternal Grandmother        Had CABG   Hypertension Paternal Grandmother    Breast cancer Neg Hx      Current Outpatient Medications:    cetirizine (ZYRTEC) 10 MG tablet, Take 10 mg by mouth daily., Disp: , Rfl:    Iron, Ferrous Sulfate, 325 (65 Fe) MG TABS, Take 1 tablet by mouth daily., Disp: 90 tablet, Rfl: 1   meloxicam (MOBIC) 15 MG tablet, Take 1 tablet by mouth daily. (Patient not taking: Reported on 04/04/2023), Disp: , Rfl:    tirzepatide (MOUNJARO) 2.5 MG/0.5ML Pen, Inject 2.5 mg into the skin once a week., Disp: 2 mL, Rfl: 0   tirzepatide (MOUNJARO) 5 MG/0.5ML Pen, Inject 5 mg into the skin once a week., Disp: 2 mL, Rfl: 1   Vitamin D, Ergocalciferol, (DRISDOL) 1.25 MG (50000 UNIT) CAPS capsule, Take 1 capsule (50,000 Units total) by mouth every 7 (seven) days., Disp: 13 capsule, Rfl: 1  Physical exam:  Vitals:   06/06/23 1133 06/06/23 1138  BP: (!) 75/37 (!) 89/58  Pulse: 87   Resp: 19   Temp: (!) 96.9 F (36.1 C)   TempSrc: Tympanic   SpO2: 96%   Weight: 205 lb 8 oz (93.2 kg)   Height: 5' (1.524 m)    Physical Exam Cardiovascular:     Rate and Rhythm: Normal rate and regular rhythm.     Heart sounds: Normal heart sounds.  Pulmonary:     Effort: Pulmonary effort is normal.     Breath sounds: Normal breath sounds.  Skin:    General: Skin is warm and dry.  Neurological:     Mental Status: She is alert and oriented to person, place, and time.      I have personally reviewed labs listed below:    Latest Ref  Rng & Units 03/24/2023    9:09 AM  CMP  Glucose 70 - 99 mg/dL 161   BUN 6 - 23 mg/dL 8   Creatinine 0.96 - 0.45 mg/dL 4.09   Sodium 811 - 914 mEq/L 139   Potassium 3.5 - 5.1 mEq/L 4.0   Chloride 96 - 112 mEq/L 100   CO2 19 - 32 mEq/L 28   Calcium 8.4 - 10.5 mg/dL 9.1   Total Protein 6.0 -  8.3 g/dL 7.9   Total Bilirubin 0.2 - 1.2 mg/dL 0.3   Alkaline Phos 39 - 117 U/L 89   AST 0 - 37 U/L 20   ALT 0 - 35 U/L 22       Latest Ref Rng & Units 06/06/2023   11:11 AM  CBC  WBC 4.0 - 10.5 K/uL 10.2   Hemoglobin 12.0 - 15.0 g/dL 16.1   Hematocrit 09.6 - 46.0 % 34.1   Platelets 150 - 400 K/uL 471     Assessment and plan- Patient is a 48 y.o. female here for routine follow-up of iron deficiency anemia  After receiving IV iron patient's hemoglobin has improved from 9.2-10.6 but has not gone back to normal yet.  Iron levels from today are pending.  If she still has evidence of iron deficiency we will plan to give her more Venofer.  CBC ferritin and iron studies in 3 and 6 months and I will see her back in 6 months.  She has GYN appointment coming up in 2 weeks   Visit Diagnosis 1. Iron deficiency anemia, unspecified iron deficiency anemia type      Dr. Owens Shark, MD, MPH [facility] at [facility] [phone] 06/06/2023 12:17 PM

## 2023-06-08 LAB — CELIAC DISEASE PANEL
Endomysial Ab, IgA: NEGATIVE
IgA: 492 mg/dL — ABNORMAL HIGH (ref 87–352)
Tissue Transglutaminase Ab, IgA: <2 U/mL (ref 0–3)

## 2023-06-12 ENCOUNTER — Inpatient Hospital Stay

## 2023-06-19 ENCOUNTER — Inpatient Hospital Stay

## 2023-06-24 ENCOUNTER — Encounter: Payer: Self-pay | Admitting: Advanced Practice Midwife

## 2023-06-24 ENCOUNTER — Encounter: Payer: Self-pay | Admitting: Nurse Practitioner

## 2023-06-24 ENCOUNTER — Ambulatory Visit: Payer: BC Managed Care – PPO | Admitting: Nurse Practitioner

## 2023-06-24 ENCOUNTER — Ambulatory Visit: Admitting: Advanced Practice Midwife

## 2023-06-24 VITALS — BP 118/85 | HR 95 | Ht 60.0 in | Wt 203.3 lb

## 2023-06-24 VITALS — BP 100/64 | HR 91 | Temp 98.6°F | Ht 60.0 in | Wt 201.0 lb

## 2023-06-24 DIAGNOSIS — Z6841 Body Mass Index (BMI) 40.0 and over, adult: Secondary | ICD-10-CM | POA: Diagnosis not present

## 2023-06-24 DIAGNOSIS — E119 Type 2 diabetes mellitus without complications: Secondary | ICD-10-CM

## 2023-06-24 DIAGNOSIS — Z7985 Long-term (current) use of injectable non-insulin antidiabetic drugs: Secondary | ICD-10-CM | POA: Diagnosis not present

## 2023-06-24 DIAGNOSIS — D509 Iron deficiency anemia, unspecified: Secondary | ICD-10-CM

## 2023-06-24 DIAGNOSIS — D500 Iron deficiency anemia secondary to blood loss (chronic): Secondary | ICD-10-CM

## 2023-06-24 DIAGNOSIS — N920 Excessive and frequent menstruation with regular cycle: Secondary | ICD-10-CM | POA: Diagnosis not present

## 2023-06-24 LAB — POCT GLYCOSYLATED HEMOGLOBIN (HGB A1C)
HbA1c POC (<> result, manual entry): 5.7 % (ref 4.0–5.6)
HbA1c, POC (controlled diabetic range): 5.7 % (ref 0.0–7.0)
HbA1c, POC (prediabetic range): 5.7 % (ref 5.7–6.4)
Hemoglobin A1C: 5.7 % — AB (ref 4.0–5.6)

## 2023-06-24 LAB — MICROALBUMIN / CREATININE URINE RATIO
Creatinine,U: 203.9 mg/dL
Microalb Creat Ratio: 10.2 mg/g (ref 0.0–30.0)
Microalb, Ur: 2.1 mg/dL — ABNORMAL HIGH (ref 0.0–1.9)

## 2023-06-24 MED ORDER — MEDROXYPROGESTERONE ACETATE 10 MG PO TABS: 10.0000 mg | ORAL_TABLET | Freq: Every day | ORAL | 2 refills | Status: DC

## 2023-06-24 MED ORDER — TIRZEPATIDE 5 MG/0.5ML ~~LOC~~ SOAJ: 5.0000 mg | SUBCUTANEOUS | 1 refills | Status: DC

## 2023-06-24 NOTE — Progress Notes (Unsigned)
 Bluford Burkitt, NP-C Phone: [phone]  Kristi Martinez is a 48 y.o. female who presents today for follow up.   Discussed the use of AI scribe software for clinical note transcription with the patient, who gave verbal consent to proceed.  History of Present Illness   Kristi Martinez is a 48 year old female with diabetes who presents for a follow-up on her diabetes management.  She is currently on Mounjaro 5 mg weekly having started with 2.5 mg for four weeks and is now in her third week of the 5 mg dose. The medication is well-tolerated with no nausea, vomiting, or abdominal pain. Initially, there was no noticeable weight loss, but she has recently observed a slight decrease in weight. She finds the clicking noise of the injection challenging, despite using music to distract herself during administration.  Her dietary habits have changed, and she feels she is eating less, noting a significant reduction in her usual intake, such as eating only half a croissant instead of three or four. She participates in a meal program through her work, which provides pre-prepared meals that are gluten-free and likely low-carb, high-protein. Despite some meals being unpalatable, she continues to consume them. She also reports increased water intake, attributing it to drinking more water rather than a symptom of her diabetes.  She recently traveled to [place] with her son, where she walked extensively, totaling 35,000 steps over two days, which left her feeling exhausted. She has not yet consulted with the nutritionist provided by her meal program.  Her A1c has improved significantly to 5.7. She continues to take oral iron  and receive iron  infusions, as her hematologist noted her iron  levels had not increased as expected.  She has a history of heavy menstrual periods, with a recent return of her period in March after a hiatus since November. The period was notably heavy, and she suspects she may be  perimenopausal. She has not had her tubes tied and is not interested in birth control.  She has no pain or wounds on her feet, which is a concern due to her family history. Her mother passed away from complications related to diabetic foot ulcers, which has made her vigilant about foot care.      Social History   Tobacco Use  Smoking Status Never  Smokeless Tobacco Never    Current Outpatient Medications on File Prior to Visit  Medication Sig Dispense Refill   cetirizine (ZYRTEC) 10 MG tablet Take 10 mg by mouth daily.     Iron , Ferrous Sulfate , 325 (65 Fe) MG TABS Take 1 tablet by mouth daily. 90 tablet 1   Vitamin D , Ergocalciferol , (DRISDOL ) 1.25 MG (50000 UNIT) CAPS capsule Take 1 capsule (50,000 Units total) by mouth every 7 (seven) days. 13 capsule 1   No current facility-administered medications on file prior to visit.     ROS see history of present illness  Objective  Physical Exam Vitals:   06/24/23 0929  BP: 100/64  Pulse: 91  Temp: 98.6 F (37 C)  SpO2: 98%    BP Readings from Last 3 Encounters:  06/24/23 118/85  06/24/23 100/64  06/06/23 (!) 89/58   Wt Readings from Last 3 Encounters:  06/24/23 203 lb 4.8 oz (92.2 kg)  06/24/23 201 lb (91.2 kg)  06/06/23 205 lb 8 oz (93.2 kg)    Physical Exam Constitutional:      General: She is not in acute distress.    Appearance: Normal appearance.  HENT:  Head: Normocephalic.  Cardiovascular:     Rate and Rhythm: Normal rate and regular rhythm.     Heart sounds: Normal heart sounds.  Pulmonary:     Effort: Pulmonary effort is normal.     Breath sounds: Normal breath sounds.  Skin:    General: Skin is warm and dry.  Neurological:     General: No focal deficit present.     Mental Status: She is alert.  Psychiatric:        Mood and Affect: Mood normal.        Behavior: Behavior normal.     Assessment/Plan: Please see individual problem list.  Type 2 diabetes mellitus without complication, without  long-term current use of insulin (HCC) Assessment & Plan: Glycemic control has improved with a POCT A1c of 5.7% today. Mounjaro 5 mg weekly is well-tolerated. She reports increased water intake and urination without hyperglycemia symptoms and has experienced positive weight loss. Continue Mounjaro 5 mg weekly. Recheck A1c in 3 months. Diabetic foot exam performed today. Encourage healthy diet and regular exercise.   Orders: -     Microalbumin / creatinine urine ratio -     POCT glycosylated hemoglobin (Hb A1C) -     Tirzepatide; Inject 5 mg into the skin once a week.  Dispense: 2 mL; Refill: 1  Morbid obesity with BMI of 40.0-44.9, adult (HCC) Assessment & Plan: 4 pound weight loss since starting Mounjaro two months ago. Anticipate additional weight loss with continued use. Encourage healthy diet and regular exercise.    Iron  deficiency anemia, unspecified iron  deficiency anemia type Assessment & Plan: She is undergoing treatment with oral iron  supplements and iron  infusions. The hematologist notes a suboptimal increase in iron  levels. Three more weeks of treatment are planned. Continue oral iron  supplementation and iron  infusions as per the hematologist's plan. She experiences irregular menstrual cycles with heavy periods and is awaiting further evaluation from OB GYN, likely contributing to iron  deficiency anemia. Follow up with hematology and ob-gyn as scheduled.       Return in about 3 months (around 09/23/2023) for Follow up.   Bluford Burkitt, NP-C [facility]

## 2023-06-24 NOTE — Patient Instructions (Addendum)
 Hysterectomy Information  A hysterectomy is a surgery to take out the uterus. The uterus is where a baby grows when a person is pregnant. Other organs may also be taken out. They are: The organs that make eggs (ovaries). The tubes that move the egg to the uterus (fallopian tubes). The lowest part of the uterus (cervix). After the surgery, you'll no longer have your period. You'll not be able to get pregnant. This surgery can affect the way you feel. Talk with your health care provider about the physical and emotional effects of this surgery. Why is a hysterectomy done? This surgery may be done if: You have growths in the uterus, such as fibroids. This is the most common reason. The lining of the uterus grows outside the uterus. Your uterus has moved down and bulges down into the vagina. You have abnormal or heavy bleeding during your period. You have pain in the pelvic area, and the pain lasts a long time. You have cancer of the uterus or cervix. What are the risks of hysterectomy? Your provider will talk with you about risks. These may include: Bleeding or blood clots in the legs or lung. Infection. Injury to areas close to the uterus. These include the nerves, bladder, or bowel. Reaction to the medicines used. Early symptoms of menopause, if both ovaries are taken out. These include hot flashes, vaginal dryness, night sweats, and lack of sleep. What can be taken out during a hysterectomy? This surgery can be done: To take out the top part of the uterus only. The cervix is not taken out. To take out the uterus and cervix. To take out the uterus, the cervix, and the tissue that holds the uterus. In some cases, your ovaries may also be taken out. The parts that will be taken out are based on the reason why you need this surgery. What are types of this surgery? The uterus can be taken out in many ways. Talk with your provider about the best surgery for you.  Here are the ones that are  done most often. There are benefits and risks for each. Abdominal hysterectomy One big cut is made in your belly. The uterus and other organs are taken out through this cut. This method is used: When your provider wants a better way to get to your uterus. If you have scars inside your belly that stick to other parts or organs. These are called adhesions. If you have this surgery: You may take longer to get well because of the big cut in your belly. There's a higher risk of injury to other tissues and organs. You may get adhesions. Vaginal hysterectomy Cuts are made in the top of the vagina. No cuts are made on your skin. The uterus is taken out through the vagina. If you have this surgery: You may have a shorter stay in the hospital. There's a lower risk of getting adhesions. You may have fewer problems after surgery. Laparoscopic-assisted vaginal hysterectomy (LAVH) Many small cuts are made in your belly and inside your vagina. LAVH is done with long, thin tools and cameras. Robots are also used. LAVH takes longer. There's also higher risk of injury to other organs. But there's less bleeding. If you have LAVH: There's less risk that germs will get into your body. You'll have a short stay in the hospital. You'll go back to your normal activities sooner. What happens after the surgery? You will be given pain medicine. You may need to stay in the hospital  for 1-2 days. You may need to have an adult stay with you for a few days after you go home. You will not be able to lift heavy objects. You will not be able to put anything in your vagina for the first 6 weeks or for the time told by your provider. This includes douching, having sex, and using tampons. If your ovaries were taken out, you may get hot flashes. You may also have night sweats, vaginal dryness, and trouble sleeping. Questions to ask your health care provider Is this surgery needed? What other options do I have? What organs  need to be taken out? How long will I need to stay in the hospital? How long will I need to get better at home? What symptoms can I expect after the surgery? This information is not intended to replace advice given to you by your health care provider. Make sure you discuss any questions you have with your health care provider. Document Revised: 11/21/2022 Document Reviewed: 06/07/2022 Elsevier Patient Education  2024 Elsevier Inc.Endometrial Ablation: What to Expect An endometrial ablation is a procedure that removes the thin layer of the lining of the uterus. This lining is called the endometrium. This procedure may be done to: Stop heavy periods that can cause low red blood cell count (anemia). Try to stop irregular bleeding from your vagina. Treat bleeding that's caused by small tumors (fibroids) in the uterus. After this procedure, you may not be able to have children. Tell a health care provider about: Any allergies you have. All medicines you're taking. These include vitamins, herbs, eye drops, creams, and over-the-counter medicines. Any problems you or family members have had with anesthesia. Any bleeding problems you have. Any surgeries you have had. Any medical conditions you have. Whether you're pregnant or may be pregnant. What are the risks? Your health care provider will talk with you about risks. These may include: Bleeding. Infection. Allergic reactions to medicines. Injury to the uterus, bowel, or nearby structures. An air bubble in the lung (air embolus). Problems with pregnancy. Scar tissue. What happens before the procedure? Medicines Ask about changing or stopping: Any medicines you take. Any vitamins, herbs, or supplements you take. Do not take aspirin or ibuprofen unless you're told to. Tests You will have tests of your uterus to make sure that there's no cancer. You may have an ultrasound of the uterus. Surgery safety For your safety, you may: Need to  wash your skin with a soap that kills germs. Get antibiotics. Have hair removed at the procedure site. General instructions Do not smoke, vape, or use nicotine or tobacco. You may be given medicines to thin the lining of the uterus. If you'll be going home right after the procedure, plan to have a responsible adult: Drive you home from the hospital or clinic. You won't be allowed to drive. Stay with you for the time you're told. What happens during the procedure?  You will lie on an exam table with your feet and legs supported. An IV will be inserted into one of your veins. You will be given a sedative to help you relax. A surgical tool with a light and camera (resectoscope) will be inserted into your vagina and moved into your uterus. This allows your surgeon to see inside your uterus. The lining of your uterus will be taken out. Your surgeon will use one of these methods: Radiofrequency. This uses an electric current to destroy the lining of the uterus. Cryotherapy. This uses something very  cold to freeze the lining of the uterus. Heated fluid. This uses very hot salt and water solution (saline) solution to destroy the lining of the uterus. Microwave. This uses high-energy waves to heat up the lining of the uterus and destroy it. Thermal balloon. A balloon filled with heated fluid is inserted into the uterus. The heated fluid destroys the lining of the uterus. These steps may vary. Ask what you can expect. What happens after the procedure? You may be watched closely until you leave. This includes checking your pain level, blood pressure, heart rate, and breathing rate. If you were given a sedative, do not drive or use machines until you're told it's safe. A sedative can make you sleepy. This information is not intended to replace advice given to you by your health care provider. Make sure you discuss any questions you have with your health care provider. Document Revised: 08/06/2022  Document Reviewed: 08/06/2022 Elsevier Patient Education  2024 Elsevier Inc.Heavy or Long-Lasting Menstrual Periods: What to Know Heavy or long-lasting periods are also called abnormal uterine bleeding. This is when your monthly periods are heavy or last longer than normal.  If you have this condition, bleeding and cramping may get so bad that they make it hard for you to do your daily activities. What are the causes? Common causes of this condition include: Growths in the uterus (polyps or fibroids). These growths are not cancer. Problems with the tissue that lines the uterus. This tissue may: Grow into the walls of the uterus (adenomyosis). Grow outside the uterus (endometriosis). One of the ovaries not releasing an egg during one or more months. A disorder that stops the blood from clotting normally. Some medicines. Infection. In some cases, the cause of this condition is not known. What increases the risk? You are more likely to get this condition if you have cancer of the uterus. What are the signs or symptoms? Symptoms of this condition include: Heavy bleeding. You may bleed so much that you have: To change your pad or tampon every 1-2 hours. To use pads and tampons at the same time. To wake up to change your pads or tampons during the night. Blood clots that are larger than 1 inch (2.5 cm) in size. Bleeding that lasts for more than 7 days. How is this diagnosed? This condition may be diagnosed based on a physical exam and your symptoms. Your health care provider will ask you about your period. You may also have tests, including: Blood tests. Pap test. Biopsy. A small piece of the tissue that lines the uterus is tested. Ultrasound of the uterus, ovaries, and vagina. Looking inside the uterus using a flexible tube with a light (hysteroscope). How is this treated? You may not need treatment for this condition. But if you need treatment, you may be given: Birth control pills or  an IUD. Hormone therapy. Medicine to reduce swelling, such as ibuprofen. Medicine to make growths in the uterus smaller. Medicines to make your blood clot. Iron  pills. Antibiotics. These are given if your bleeding is caused by an infection. If medicines do not work, surgery may be done. Surgery may be done to: Remove a part of the lining of the uterus. Remove growths in the uterus. These may be polyps or fibroids. Remove the entire lining of the uterus. Remove the uterus entirely. Talk with your provider about your options for treatment. Some treatments may make it hard for you to get pregnant. Tell your provider if you'd like to get  pregnant after treatment. Follow these instructions at home: Medicines Take your medicines only as told. Do not change or switch medicines without asking your provider. Do not take aspirin or medicines that have aspirin 1 week before your period, or during your period. Aspirin may make bleeding worse. Managing trouble pooping Your iron  pills may cause trouble pooping (constipation). To help prevent or treat this, you may need to: Take medicines to help you poop. Eat foods high in fiber, like beans, whole grains, and fresh fruits and vegetables. Drink more fluids as told. General instructions If you need to change your pad or tampon more than once every 2 hours, limit your activity until the bleeding stops. Eat well-balanced meals, including foods that are high in iron . Foods that have a lot of iron  include leafy, green vegetables, meat, liver, eggs, and whole-grain breads and cereals. Do not try to lose weight until the heavy bleeding has stopped and your blood iron  level is back to normal. If you need to lose weight, work with your provider to lose weight safely. Keep all follow-up visits. Your provider will make sure your treatment is working. Your provider may adjust your treatment plan if it is not working. Contact a health care provider if: You soak  through a pad or tampon every 1 or 2 hours, and this happens every time you have a period. You need to use pads and tampons at the same time because you are bleeding so much. You vomit or feel like you vomiting. You have watery poop (diarrhea). You have problems from the medicines you are taking. You feel very weak or tired. You feel dizzy or you faint. Get help right away if: You soak through more than a pad or tampon in 1 hour. You pass clots bigger than 1 inch (2.5 cm) wide. You feel short of breath. You feel like your heart is beating too fast. These symptoms may be an emergency. Call 911 right away. Do not wait to see if the symptoms will go away. Do not drive yourself to the hospital. This information is not intended to replace advice given to you by your health care provider. Make sure you discuss any questions you have with your health care provider. Document Revised: 12/31/2022 Document Reviewed: 08/13/2022 Elsevier Patient Education  2024 [employer].

## 2023-06-25 NOTE — Assessment & Plan Note (Addendum)
 She is undergoing treatment with oral iron  supplements and iron  infusions. The hematologist notes a suboptimal increase in iron  levels. Three more weeks of treatment are planned. Continue oral iron  supplementation and iron  infusions as per the hematologist's plan. She experiences irregular menstrual cycles with heavy periods and is awaiting further evaluation from OB GYN, likely contributing to iron  deficiency anemia. Follow up with hematology and ob-gyn as scheduled.

## 2023-06-25 NOTE — Assessment & Plan Note (Addendum)
 4 pound weight loss since starting Mounjaro two months ago. Anticipate additional weight loss with continued use. Encourage healthy diet and regular exercise.

## 2023-06-25 NOTE — Assessment & Plan Note (Signed)
 Glycemic control has improved with a POCT A1c of 5.7% today. Mounjaro 5 mg weekly is well-tolerated. She reports increased water intake and urination without hyperglycemia symptoms and has experienced positive weight loss. Continue Mounjaro 5 mg weekly. Recheck A1c in 3 months. Diabetic foot exam performed today. Encourage healthy diet and regular exercise.

## 2023-06-26 ENCOUNTER — Other Ambulatory Visit: Payer: Self-pay | Admitting: Nurse Practitioner

## 2023-06-26 ENCOUNTER — Inpatient Hospital Stay

## 2023-06-26 VITALS — BP 110/71 | HR 97 | Temp 97.8°F | Resp 16

## 2023-06-26 DIAGNOSIS — D509 Iron deficiency anemia, unspecified: Secondary | ICD-10-CM

## 2023-06-26 MED ORDER — IRON SUCROSE 20 MG/ML IV SOLN
200.0000 mg | Freq: Once | INTRAVENOUS | Status: AC
  Administered 2023-06-26: 200 mg via INTRAVENOUS

## 2023-06-30 ENCOUNTER — Encounter: Payer: Self-pay | Admitting: Advanced Practice Midwife

## 2023-06-30 NOTE — Progress Notes (Signed)
 Patient ID: Kristi Martinez, female   DOB: 10-07-1975, 48 y.o.   MRN: [id number]  Reason for Visit: Menorrhagia and Establish Care  Referring Provider: Bluford Burkitt, NP Subjective:  Date of Service: 06/24/2023  HPI:  Kristi Martinez is a 48 y.o. female being seen to discuss ongoing heavy menstrual periods. She has an 8 year history of heavy bleeding and currently she is having some irregular periods. She had a period monthly through  November 2024 and then not again until March. Periods usually last 7-10 days, are heavy most days- changing pad and tampon hourly on heavy days. She has some cramping with periods. Well woman visit with PCP on 03/24/23, anemia diagnosed- iron  infusions recommended. HPV+ PAP- repeat in 1 year. BR 1 mammogram 04/17/23. Not sexually active. No known history of fibroids.  Past Medical History:  Diagnosis Date   Abortion in first trimester    Appendicitis 2002   Arthritis    Diabetes mellitus without complication (HCC)    Eczema    Herpes genitalis    Vitiligo    Family History  Problem Relation Age of Onset   Diabetes Mellitus II Mother    Hypertension Mother    Hypertension Father    Diabetes Sister        Gestational   Hodgkin's lymphoma Maternal Grandmother    Heart disease Paternal Grandmother        Had CABG   Hypertension Paternal Grandmother    Breast cancer Neg Hx    Past Surgical History:  Procedure Laterality Date   APPENDECTOMY     CESAREAN SECTION     x2   CHOLECYSTECTOMY      Short Social History:  Social History   Tobacco Use   Smoking status: Never   Smokeless tobacco: Never  Substance Use Topics   Alcohol use: No    Comment: Social Drinker    No Known Allergies  Current Outpatient Medications  Medication Sig Dispense Refill   medroxyPROGESTERone  (PROVERA ) 10 MG tablet Take 1 tablet (10 mg total) by mouth daily. 10 tablet 2   cetirizine (ZYRTEC) 10 MG tablet Take 10 mg by mouth daily.     Iron , Ferrous Sulfate , 325  (65 Fe) MG TABS Take 1 tablet by mouth daily. 90 tablet 1   tirzepatide (MOUNJARO) 5 MG/0.5ML Pen Inject 5 mg into the skin once a week. 2 mL 1   Vitamin D , Ergocalciferol , (DRISDOL ) 1.25 MG (50000 UNIT) CAPS capsule Take 1 capsule (50,000 Units total) by mouth every 7 (seven) days. 13 capsule 1   No current facility-administered medications for this visit.    Review of Systems  Constitutional:  Positive for malaise/fatigue. Negative for chills and fever.  HENT:  Negative for congestion, ear discharge, ear pain, hearing loss, sinus pain and sore throat.   Eyes:  Negative for blurred vision and double vision.  Respiratory:  Negative for cough, shortness of breath and wheezing.   Cardiovascular:  Negative for chest pain, palpitations and leg swelling.  Gastrointestinal:  Negative for abdominal pain, blood in stool, constipation, diarrhea, heartburn, melena, nausea and vomiting.  Genitourinary:  Negative for dysuria, flank pain, frequency, hematuria and urgency.  Musculoskeletal:  Negative for back pain, joint pain and myalgias.  Skin:  Negative for itching and rash.  Neurological:  Negative for dizziness, tingling, tremors, sensory change, speech change, focal weakness, seizures, loss of consciousness, weakness and headaches.  Endo/Heme/Allergies:  Positive for environmental allergies. Does not bruise/bleed easily.  Positive for heavy bleeding  Psychiatric/Behavioral:  Negative for depression, hallucinations, memory loss, substance abuse and suicidal ideas. The patient is not nervous/anxious and does not have insomnia.        Objective:  Objective   Vitals:   06/24/23 1407  BP: 118/85  Pulse: 95  Weight: 203 lb 4.8 oz (92.2 kg)  Height: 5' (1.524 m)   Body mass index is 39.7 kg/m. Constitutional: obese female in no acute distress.  HEENT: normal Skin: Warm and dry.  Cardiovascular: Regular rate and rhythm.   Extremity: no edema Respiratory:  Normal respiratory  effort Psych: Alert and Oriented x3. No memory deficits. Normal mood and affect.  MS: normal gait, normal bilateral lower extremity ROM/strength/stability.   Assessment/Plan:     48 y.o. G2 P82 female with menorrhagia  Rx Provera  Gyn ultrasound F/U with MD to discuss possible ablation or hysterectomy   Angelita Kendall, CNM [facility] 06/30/2023 12:29 PM

## 2023-07-03 ENCOUNTER — Inpatient Hospital Stay: Attending: Oncology

## 2023-07-03 VITALS — BP 120/83 | HR 88 | Temp 97.2°F | Resp 18

## 2023-07-03 DIAGNOSIS — D509 Iron deficiency anemia, unspecified: Secondary | ICD-10-CM | POA: Diagnosis present

## 2023-07-03 MED ORDER — IRON SUCROSE 20 MG/ML IV SOLN
200.0000 mg | Freq: Once | INTRAVENOUS | Status: AC
  Administered 2023-07-03: 200 mg via INTRAVENOUS

## 2023-07-03 NOTE — Patient Instructions (Signed)

## 2023-07-10 ENCOUNTER — Inpatient Hospital Stay

## 2023-07-10 VITALS — BP 112/68 | HR 88 | Temp 97.7°F | Resp 18

## 2023-07-10 DIAGNOSIS — D509 Iron deficiency anemia, unspecified: Secondary | ICD-10-CM | POA: Diagnosis not present

## 2023-07-10 MED ORDER — SODIUM CHLORIDE 0.9% FLUSH
10.0000 mL | Freq: Once | INTRAVENOUS | Status: AC
  Administered 2023-07-10: 10 mL via INTRAVENOUS
  Filled 2023-07-10: qty 10

## 2023-07-10 MED ORDER — IRON SUCROSE 20 MG/ML IV SOLN
200.0000 mg | Freq: Once | INTRAVENOUS | Status: AC
Start: 2023-07-10 — End: 2023-07-10
  Administered 2023-07-10: 200 mg via INTRAVENOUS
  Filled 2023-07-10: qty 10

## 2023-07-10 NOTE — Progress Notes (Signed)
 Patient declined to wait the 30 minutes for post iron infusion observation today. Tolerated infusion well. VSS.

## 2023-08-07 ENCOUNTER — Encounter: Payer: Self-pay | Admitting: Advanced Practice Midwife

## 2023-09-08 ENCOUNTER — Inpatient Hospital Stay: Attending: Oncology

## 2023-09-08 DIAGNOSIS — D509 Iron deficiency anemia, unspecified: Secondary | ICD-10-CM | POA: Insufficient documentation

## 2023-09-08 LAB — CBC WITH DIFFERENTIAL (CANCER CENTER ONLY)
Abs Immature Granulocytes: 0.02 K/uL (ref 0.00–0.07)
Basophils Absolute: 0.1 K/uL (ref 0.0–0.1)
Basophils Relative: 1 %
Eosinophils Absolute: 0.3 K/uL (ref 0.0–0.5)
Eosinophils Relative: 3 %
HCT: 41.6 % (ref 36.0–46.0)
Hemoglobin: 13.8 g/dL (ref 12.0–15.0)
Immature Granulocytes: 0 %
Lymphocytes Relative: 26 %
Lymphs Abs: 2.6 K/uL (ref 0.7–4.0)
MCH: 27.5 pg (ref 26.0–34.0)
MCHC: 33.2 g/dL (ref 30.0–36.0)
MCV: 82.9 fL (ref 80.0–100.0)
Monocytes Absolute: 0.4 K/uL (ref 0.1–1.0)
Monocytes Relative: 4 %
Neutro Abs: 6.7 K/uL (ref 1.7–7.7)
Neutrophils Relative %: 66 %
Platelet Count: 303 K/uL (ref 150–400)
RBC: 5.02 MIL/uL (ref 3.87–5.11)
RDW: 17.6 % — ABNORMAL HIGH (ref 11.5–15.5)
WBC Count: 10 K/uL (ref 4.0–10.5)
nRBC: 0 % (ref 0.0–0.2)

## 2023-09-08 LAB — IRON AND TIBC
Iron: 61 ug/dL (ref 28–170)
Saturation Ratios: 21 % (ref 10.4–31.8)
TIBC: 288 ug/dL (ref 250–450)
UIBC: 227 ug/dL

## 2023-09-08 LAB — FERRITIN: Ferritin: 78 ng/mL (ref 11–307)

## 2023-09-26 ENCOUNTER — Ambulatory Visit: Admitting: Nurse Practitioner

## 2023-09-26 VITALS — BP 120/78 | HR 73 | Temp 98.2°F | Ht 60.0 in | Wt 197.6 lb

## 2023-09-26 DIAGNOSIS — D509 Iron deficiency anemia, unspecified: Secondary | ICD-10-CM | POA: Diagnosis not present

## 2023-09-26 DIAGNOSIS — E119 Type 2 diabetes mellitus without complications: Secondary | ICD-10-CM

## 2023-09-26 DIAGNOSIS — Z6841 Body Mass Index (BMI) 40.0 and over, adult: Secondary | ICD-10-CM

## 2023-09-26 DIAGNOSIS — N920 Excessive and frequent menstruation with regular cycle: Secondary | ICD-10-CM

## 2023-09-26 DIAGNOSIS — Z7985 Long-term (current) use of injectable non-insulin antidiabetic drugs: Secondary | ICD-10-CM

## 2023-09-26 MED ORDER — TIRZEPATIDE 7.5 MG/0.5ML ~~LOC~~ SOAJ: 7.5000 mg | SUBCUTANEOUS | 0 refills | Status: DC

## 2023-09-26 NOTE — Progress Notes (Signed)
 Leron Glance, NP-C Phone: [phone]  Kristi Martinez is a 48 y.o. female who presents today for follow up.   Discussed the use of AI scribe software for clinical note transcription with the patient, who gave verbal consent to proceed.  History of Present Illness   Kristi Martinez is a 48 year old female with type 2 diabetes who presents for follow-up on Mounjaro  treatment and weight management.  She is currently on Mounjaro  for type 2 diabetes management. The medication's effect diminishes by day four, but she has not experienced any side effects such as nausea, vomiting, diarrhea, or constipation. She has lost six pounds since her last visit and is now under 200 pounds for the first time in a long time. Her A1c has improved from 8 to 5.7, and she is not experiencing excessive thirst, urination, or symptoms of hypoglycemia.  Her diet includes smaller portions, and she consumes a protein water in the morning, which she finds satisfying. She also incorporates salads into her meals. She engages in daily walks with her neighbor, which last about 45 minutes and include hilly terrain, providing some cardiovascular exercise.  She is planning a trip and is concerned about the storage of her medication, as it needs to stay cold. She plans to take her medication before leaving and adjust her schedule accordingly upon return.  She has a history of iron  deficiency anemia, for which she previously received two rounds of iron  infusions. She reports that her iron  levels have improved and she has not needed further infusions. She has been managing her heavy menstrual bleeding with medication, which has been effective in reducing the frequency and severity of her periods.  Her father has dementia, and she is planning a trip with him, which may be his last due to his condition. He is in the moderate stages of dementia, often forgetting daily plans and sometimes confusing her with other people.      Social  History   Tobacco Use  Smoking Status Never  Smokeless Tobacco Never    Current Outpatient Medications on File Prior to Visit  Medication Sig Dispense Refill   cetirizine (ZYRTEC) 10 MG tablet Take 10 mg by mouth daily.     Iron , Ferrous Sulfate , 325 (65 Fe) MG TABS Take 1 tablet by mouth daily. 90 tablet 1   medroxyPROGESTERone  (PROVERA ) 10 MG tablet Take 1 tablet (10 mg total) by mouth daily. 10 tablet 2   Vitamin D , Ergocalciferol , (DRISDOL ) 1.25 MG (50000 UNIT) CAPS capsule Take 1 capsule (50,000 Units total) by mouth every 7 (seven) days. 13 capsule 1   No current facility-administered medications on file prior to visit.     ROS see history of present illness  Objective  Physical Exam Vitals:   09/26/23 0851  BP: 120/78  Pulse: 73  Temp: 98.2 F (36.8 C)  SpO2: 96%    BP Readings from Last 3 Encounters:  09/26/23 120/78  07/10/23 112/68  07/03/23 120/83   Wt Readings from Last 3 Encounters:  09/26/23 197 lb 9.6 oz (89.6 kg)  06/24/23 203 lb 4.8 oz (92.2 kg)  06/24/23 201 lb (91.2 kg)    Physical Exam Constitutional:      General: She is not in acute distress.    Appearance: Normal appearance.  HENT:     Head: Normocephalic.  Cardiovascular:     Rate and Rhythm: Normal rate and regular rhythm.     Heart sounds: Normal heart sounds.  Pulmonary:  Effort: Pulmonary effort is normal.     Breath sounds: Normal breath sounds.  Skin:    General: Skin is warm and dry.  Neurological:     General: No focal deficit present.     Mental Status: She is alert.  Psychiatric:        Mood and Affect: Mood normal.        Behavior: Behavior normal.      Assessment/Plan: Please see individual problem list.  Type 2 diabetes mellitus without complication, without long-term current use of insulin (HCC) Assessment & Plan: Diabetes is well-controlled with an improved Hemoglobin A1c of 5.7%. She reports no symptoms of hyperglycemia or hypoglycemia. Mounjaro  dose  adjustment is necessary due to medication wearing off. Increase Mounjaro  to 7.5 mg weekly. Continue current diet and exercise regimen. Check Hemoglobin A1c in three months. Adjust Mounjaro  injection schedule around travel plans.   Orders: -     Tirzepatide ; Inject 7.5 mg into the skin once a week.  Dispense: 6 mL; Refill: 0  Iron  deficiency anemia, unspecified iron  deficiency anemia type Assessment & Plan: Anemia has improved following iron  infusions, with significant symptom relief. We will continue to monitor. Suspect improvement with control of heavy menstrual bleeding. Follow up with Hematology as scheduled.    Menorrhagia with regular cycle Assessment & Plan: Bleeding is controlled with medication, and significant improvement is noted. She declined an ultrasound as symptoms are managed. Continue current medication for heavy bleeding. Monitor symptoms and bleeding pattern. Follow up with Ob-Gyn.    Morbid obesity with BMI of 40.0-44.9, adult (HCC) -     Tirzepatide ; Inject 7.5 mg into the skin once a week.  Dispense: 6 mL; Refill: 0    Return in about 3 months (around 12/27/2023) for Follow up.   Leron Glance, NP-C [facility]

## 2023-10-07 ENCOUNTER — Encounter: Payer: Self-pay | Admitting: Nurse Practitioner

## 2023-10-07 NOTE — Assessment & Plan Note (Signed)
 Diabetes is well-controlled with an improved Hemoglobin A1c of 5.7%. She reports no symptoms of hyperglycemia or hypoglycemia. Mounjaro  dose adjustment is necessary due to medication wearing off. Increase Mounjaro  to 7.5 mg weekly. Continue current diet and exercise regimen. Check Hemoglobin A1c in three months. Adjust Mounjaro  injection schedule around travel plans.

## 2023-10-07 NOTE — Assessment & Plan Note (Signed)
 Anemia has improved following iron  infusions, with significant symptom relief. We will continue to monitor. Suspect improvement with control of heavy menstrual bleeding. Follow up with Hematology as scheduled.

## 2023-10-07 NOTE — Assessment & Plan Note (Signed)
 Bleeding is controlled with medication, and significant improvement is noted. She declined an ultrasound as symptoms are managed. Continue current medication for heavy bleeding. Monitor symptoms and bleeding pattern. Follow up with Ob-Gyn.

## 2023-11-17 ENCOUNTER — Other Ambulatory Visit: Payer: Self-pay | Admitting: Advanced Practice Midwife

## 2023-11-17 DIAGNOSIS — D500 Iron deficiency anemia secondary to blood loss (chronic): Secondary | ICD-10-CM

## 2023-11-17 DIAGNOSIS — N920 Excessive and frequent menstruation with regular cycle: Secondary | ICD-10-CM

## 2023-11-18 ENCOUNTER — Encounter: Payer: Self-pay | Admitting: Advanced Practice Midwife

## 2023-11-18 ENCOUNTER — Other Ambulatory Visit: Payer: Self-pay

## 2023-11-18 DIAGNOSIS — N920 Excessive and frequent menstruation with regular cycle: Secondary | ICD-10-CM

## 2023-11-18 DIAGNOSIS — D500 Iron deficiency anemia secondary to blood loss (chronic): Secondary | ICD-10-CM

## 2023-11-18 MED ORDER — MEDROXYPROGESTERONE ACETATE 10 MG PO TABS
10.0000 mg | ORAL_TABLET | Freq: Every day | ORAL | 0 refills | Status: DC
Start: 2023-11-18 — End: 2023-11-26

## 2023-11-18 NOTE — Telephone Encounter (Signed)
 I sent in one refill. Do you need her to come in to follow up on this to continue? Per Annie it needs to be titrated.

## 2023-11-18 NOTE — Telephone Encounter (Signed)
 Can I refill provera ? Slater had put 2 refills and in the note it said pt would f/u with MD to discuss ablation or hysterectomy. I didn't know if she can take this on the regular long term. Please advise

## 2023-11-26 ENCOUNTER — Other Ambulatory Visit: Payer: Self-pay | Admitting: Advanced Practice Midwife

## 2023-11-26 DIAGNOSIS — D500 Iron deficiency anemia secondary to blood loss (chronic): Secondary | ICD-10-CM

## 2023-11-26 DIAGNOSIS — N920 Excessive and frequent menstruation with regular cycle: Secondary | ICD-10-CM

## 2023-11-26 MED ORDER — MEDROXYPROGESTERONE ACETATE 10 MG PO TABS: 10.0000 mg | ORAL_TABLET | Freq: Every day | ORAL | 5 refills | Status: AC

## 2023-11-26 NOTE — Progress Notes (Signed)
 Refills ordered for provera . Message to patient. Ultrasound encouraged.

## 2023-12-08 ENCOUNTER — Inpatient Hospital Stay: Admitting: Oncology

## 2023-12-08 ENCOUNTER — Encounter: Payer: Self-pay | Admitting: Oncology

## 2023-12-08 ENCOUNTER — Inpatient Hospital Stay: Attending: Oncology

## 2023-12-08 VITALS — BP 99/69 | HR 85 | Temp 97.1°F | Resp 18 | Ht 60.0 in | Wt 187.6 lb

## 2023-12-08 DIAGNOSIS — D509 Iron deficiency anemia, unspecified: Secondary | ICD-10-CM | POA: Insufficient documentation

## 2023-12-08 LAB — IRON AND TIBC
Iron: 52 ug/dL (ref 28–170)
Saturation Ratios: 20 % (ref 10.4–31.8)
TIBC: 265 ug/dL (ref 250–450)
UIBC: 213 ug/dL

## 2023-12-08 LAB — CBC WITH DIFFERENTIAL (CANCER CENTER ONLY)
Abs Immature Granulocytes: 0.03 K/uL (ref 0.00–0.07)
Basophils Absolute: 0.1 K/uL (ref 0.0–0.1)
Basophils Relative: 1 %
Eosinophils Absolute: 0.2 K/uL (ref 0.0–0.5)
Eosinophils Relative: 2 %
HCT: 39.1 % (ref 36.0–46.0)
Hemoglobin: 13.2 g/dL (ref 12.0–15.0)
Immature Granulocytes: 0 %
Lymphocytes Relative: 23 %
Lymphs Abs: 2.1 K/uL (ref 0.7–4.0)
MCH: 29.4 pg (ref 26.0–34.0)
MCHC: 33.8 g/dL (ref 30.0–36.0)
MCV: 87.1 fL (ref 80.0–100.0)
Monocytes Absolute: 0.4 K/uL (ref 0.1–1.0)
Monocytes Relative: 5 %
Neutro Abs: 6.2 K/uL (ref 1.7–7.7)
Neutrophils Relative %: 69 %
Platelet Count: 448 K/uL — ABNORMAL HIGH (ref 150–400)
RBC: 4.49 MIL/uL (ref 3.87–5.11)
RDW: 13.2 % (ref 11.5–15.5)
WBC Count: 9 K/uL (ref 4.0–10.5)
nRBC: 0 % (ref 0.0–0.2)

## 2023-12-08 LAB — FERRITIN: Ferritin: 88 ng/mL (ref 11–307)

## 2023-12-08 NOTE — Progress Notes (Signed)
 Patient has no new or acute concerns at this time.

## 2023-12-08 NOTE — Progress Notes (Signed)
 Hematology/Oncology Consult note [facility]  [phone] [phone]  Patient Care Team: Gretel App, NP as PCP - General (Nurse Practitioner)   Name of the patient: Kristi Martinez  [id number]  09-04-1975   Date of visit: 12/08/23  Diagnosis-iron  deficiency anemia  Chief complaint/ Reason for visit-routine follow-up of iron  deficiency anemia  Heme/Onc history:  Patient is a 48 year old female with no significant past medical history referred for iron  deficiency anemia.  CBC from 03/28/2023 showed white count of 11, H&H of 9.2/32.7 with an MCV of 66.6 and platelet count of 631.  Ferritin levels were low at 3.7 with an iron  saturation of 4%.  She has never had an EGD or colonoscopy in the past.  She will be getting her Cologuard testing done soon.  Her menstrual cycles are heavy and can last for 7 to 10 days.  She often requires to change her tampons and sanitary pads every 2 hours.     Interval history-patient was seen by GYN for her symptoms of menorrhagia and they prescribed her oral contraceptive pills which have been working for her.  Menstrual cycles are lighter.  There are couple of months when she does not get any menstrual cycles at all.  She is yet to undergo pelvic ultrasound which was recommended by them.  She feels good at this time and denies any specific complaints  ECOG PS- 0 Pain scale- 0   Review of systems- Review of Systems  Constitutional:  Negative for chills, fever, malaise/fatigue and weight loss.  HENT:  Negative for congestion, ear discharge and nosebleeds.   Eyes:  Negative for blurred vision.  Respiratory:  Negative for cough, hemoptysis, sputum production, shortness of breath and wheezing.   Cardiovascular:  Negative for chest pain, palpitations, orthopnea and claudication.  Gastrointestinal:  Negative for abdominal pain, blood in stool, constipation, diarrhea, heartburn, melena, nausea and vomiting.   Genitourinary:  Negative for dysuria, flank pain, frequency, hematuria and urgency.  Musculoskeletal:  Negative for back pain, joint pain and myalgias.  Skin:  Negative for rash.  Neurological:  Negative for dizziness, tingling, focal weakness, seizures, weakness and headaches.  Endo/Heme/Allergies:  Does not bruise/bleed easily.  Psychiatric/Behavioral:  Negative for depression and suicidal ideas. The patient does not have insomnia.       No Known Allergies   Past Medical History:  Diagnosis Date   Abortion in first trimester    Appendicitis 2002   Arthritis    Diabetes mellitus without complication (HCC)    Eczema    Herpes genitalis    Vitiligo      Past Surgical History:  Procedure Laterality Date   APPENDECTOMY     CESAREAN SECTION     x2   CHOLECYSTECTOMY      Social History   Socioeconomic History   Marital status: Single    Spouse name: Not on file   Number of children: Not on file   Years of education: Not on file   Highest education level: Some college, no degree  Occupational History   Not on file  Tobacco Use   Smoking status: Never   Smokeless tobacco: Never  Vaping Use   Vaping status: Never Used  Substance and Sexual Activity   Alcohol use: No    Comment: Social Drinker   Drug use: No   Sexual activity: Not Currently    Birth control/protection: I.U.D.    Comment: Mirena   Other Topics Concern   Not on file  Social History Narrative   Patient has 2 boys whom she loves dearly! Oldest son is in college at [employer] as a freshman, scheduled to graduate in 2028! Her youngest son is in 5th grade and loves to play basketball/baseball!   Social Drivers of Corporate investment banker Strain: Low Risk  (09/25/2023)   Overall Financial Resource Strain (CARDIA)    Difficulty of Paying Living Expenses: Not very hard  Food Insecurity: No Food Insecurity (09/25/2023)   Hunger Vital Sign    Worried About Running Out of Food in the Last Year: Never true     Ran Out of Food in the Last Year: Never true  Transportation Needs: No Transportation Needs (09/25/2023)   PRAPARE - Administrator, Civil Service (Medical): No    Lack of Transportation (Non-Medical): No  Physical Activity: Sufficiently Active (09/25/2023)   Exercise Vital Sign    Days of Exercise per Week: 5 days    Minutes of Exercise per Session: 40 min  Stress: No Stress Concern Present (09/25/2023)   [employer] of Occupational Health - Occupational Stress Questionnaire    Feeling of Stress: Only a little  Social Connections: Moderately Isolated (09/25/2023)   Social Connection and Isolation Panel    Frequency of Communication with Friends and Family: More than three times a week    Frequency of Social Gatherings with Friends and Family: Once a week    Attends Religious Services: Patient declined    Database administrator or Organizations: Yes    Attends Banker Meetings: 1 to 4 times per year    Marital Status: Never married  Intimate Partner Violence: Not At Risk (04/04/2023)   Humiliation, Afraid, Rape, and Kick questionnaire    Fear of Current or Ex-Partner: No    Emotionally Abused: No    Physically Abused: No    Sexually Abused: No    Family History  Problem Relation Age of Onset   Diabetes Mellitus II Mother    Hypertension Mother    Hypertension Father    Diabetes Sister        Gestational   Hodgkin's lymphoma Maternal Grandmother    Heart disease Paternal Grandmother        Had CABG   Hypertension Paternal Grandmother    Breast cancer Neg Hx      Current Outpatient Medications:    cetirizine (ZYRTEC) 10 MG tablet, Take 10 mg by mouth daily., Disp: , Rfl:    Iron , Ferrous Sulfate , 325 (65 Fe) MG TABS, Take 1 tablet by mouth daily., Disp: 90 tablet, Rfl: 1   medroxyPROGESTERone  (PROVERA ) 10 MG tablet, Take 1 tablet (10 mg total) by mouth daily., Disp: 10 tablet, Rfl: 5   tirzepatide  (MOUNJARO ) 7.5 MG/0.5ML Pen, Inject 7.5 mg into  the skin once a week., Disp: 6 mL, Rfl: 0   Vitamin D , Ergocalciferol , (DRISDOL ) 1.25 MG (50000 UNIT) CAPS capsule, Take 1 capsule (50,000 Units total) by mouth every 7 (seven) days., Disp: 13 capsule, Rfl: 1  Physical exam:  Vitals:   12/08/23 0952  BP: 99/69  Pulse: 85  Resp: 18  Temp: (!) 97.1 F (36.2 C)  TempSrc: Tympanic  SpO2: 99%  Weight: 187 lb 9.6 oz (85.1 kg)  Height: 5' (1.524 m)   Physical Exam Cardiovascular:     Rate and Rhythm: Normal rate and regular rhythm.     Heart sounds: Normal heart sounds.  Pulmonary:     Effort: Pulmonary effort is normal.  Breath sounds: Normal breath sounds.  Skin:    General: Skin is warm and dry.  Neurological:     Mental Status: She is alert and oriented to person, place, and time.      I have personally reviewed labs listed below:    Latest Ref Rng & Units 03/24/2023    9:09 AM  CMP  Glucose 70 - 99 mg/dL 864   BUN 6 - 23 mg/dL 8   Creatinine 9.59 - 8.79 mg/dL 9.49   Sodium 864 - 854 mEq/L 139   Potassium 3.5 - 5.1 mEq/L 4.0   Chloride 96 - 112 mEq/L 100   CO2 19 - 32 mEq/L 28   Calcium 8.4 - 10.5 mg/dL 9.1   Total Protein 6.0 - 8.3 g/dL 7.9   Total Bilirubin 0.2 - 1.2 mg/dL 0.3   Alkaline Phos 39 - 117 U/L 89   AST 0 - 37 U/L 20   ALT 0 - 35 U/L 22       Latest Ref Rng & Units 12/08/2023    9:34 AM  CBC  WBC 4.0 - 10.5 K/uL 9.0   Hemoglobin 12.0 - 15.0 g/dL 86.7   Hematocrit 63.9 - 46.0 % 39.1   Platelets 150 - 400 K/uL 448      Assessment and plan- Patient is a 48 y.o. female here for routine follow Up of iron  deficiency anemia  Patient is not presently anemic with an H&H of 13.2/29.1.  Iron  studies show a ferritin level of 88 with iron  saturation of 20%.  She does not require any IV iron  at this time.  CBC ferritin and iron  studies in 4 and 8 months and I will see her back in 8 months   Visit Diagnosis 1. Iron  deficiency anemia, unspecified iron  deficiency anemia type      Dr. Annah Skene, MD,  [facility] at [facility] [phone] 12/08/2023 10:34 AM

## 2023-12-31 ENCOUNTER — Ambulatory Visit: Admitting: Nurse Practitioner

## 2023-12-31 VITALS — BP 110/78 | HR 75 | Temp 98.6°F | Ht 60.0 in | Wt 186.8 lb

## 2023-12-31 DIAGNOSIS — E119 Type 2 diabetes mellitus without complications: Secondary | ICD-10-CM

## 2023-12-31 DIAGNOSIS — Z7985 Long-term (current) use of injectable non-insulin antidiabetic drugs: Secondary | ICD-10-CM

## 2023-12-31 DIAGNOSIS — S0003XA Contusion of scalp, initial encounter: Secondary | ICD-10-CM

## 2023-12-31 DIAGNOSIS — Z6836 Body mass index (BMI) 36.0-36.9, adult: Secondary | ICD-10-CM | POA: Diagnosis not present

## 2023-12-31 DIAGNOSIS — Z6841 Body Mass Index (BMI) 40.0 and over, adult: Secondary | ICD-10-CM

## 2023-12-31 DIAGNOSIS — E669 Obesity, unspecified: Secondary | ICD-10-CM | POA: Insufficient documentation

## 2023-12-31 LAB — POCT GLYCOSYLATED HEMOGLOBIN (HGB A1C)
HbA1c POC (<> result, manual entry): 5.3 % (ref 4.0–5.6)
HbA1c, POC (controlled diabetic range): 5.3 % (ref 0.0–7.0)
HbA1c, POC (prediabetic range): 5.3 % — AB (ref 5.7–6.4)
Hemoglobin A1C: 5.3 % (ref 4.0–5.6)

## 2023-12-31 MED ORDER — TIRZEPATIDE 10 MG/0.5ML ~~LOC~~ SOAJ: 10.0000 mg | SUBCUTANEOUS | 0 refills | Status: DC

## 2023-12-31 NOTE — Progress Notes (Signed)
 Leron Glance, NP-C Phone: [phone]  Kristi Martinez is a 48 y.o. female who presents today for follow up.   Discussed the use of AI scribe software for clinical note transcription with the patient, who gave verbal consent to proceed.  History of Present Illness   Kristi Martinez is a 48 year old female with type 2 diabetes who presents for follow-up on weight management and diabetes control.  She has been on Mounjaro  7.5 mg for three months for weight management and diabetes control. She has experienced a plateau in her weight loss, currently at 187 pounds, after losing a total of 17 pounds since starting the medication, with 11 pounds lost in the last three months. Despite the weight loss, she does not perceive a significant change in her appearance, although others have noticed it.  Her diet is generally good, but she has experienced some discouragement due to the weight plateau. She uses a new app to monitor her medication, water, protein, fiber, and calorie intake. She is consistent with her diet from Monday to Friday but struggles on Saturdays. She is actively seeking ways to increase her fiber intake, as she believes it is currently low. She drinks at least 60 ounces of water daily, sometimes more, and aims to maintain this level of hydration.  No symptoms of hypoglycemia such as dizziness, lightheadedness, clamminess, or shakiness. Her last A1c was 5.7, down from an initial 8.  She mentions a recent incident where she was hit on the head with a foul ball four days ago, resulting in soreness but no headache, nausea, vomiting, or loss of consciousness. She applied ice on the first two days and has been taking ibuprofen. The soreness has improved, and she is now able to touch the area without significant pain. She has been sleeping on the opposite side to avoid discomfort.  She recently saw hematology three weeks ago, where her blood counts were satisfactory, and no infusions were  needed. She is scheduled to follow up in eight months.      Social History   Tobacco Use  Smoking Status Never  Smokeless Tobacco Never    Current Outpatient Medications on File Prior to Visit  Medication Sig Dispense Refill   cetirizine (ZYRTEC) 10 MG tablet Take 10 mg by mouth daily.     Iron , Ferrous Sulfate , 325 (65 Fe) MG TABS Take 1 tablet by mouth daily. 90 tablet 1   medroxyPROGESTERone  (PROVERA ) 10 MG tablet Take 1 tablet (10 mg total) by mouth daily. 10 tablet 5   Vitamin D , Ergocalciferol , (DRISDOL ) 1.25 MG (50000 UNIT) CAPS capsule Take 1 capsule (50,000 Units total) by mouth every 7 (seven) days. 13 capsule 1   No current facility-administered medications on file prior to visit.     ROS see history of present illness  Objective  Physical Exam Vitals:   12/31/23 0841  BP: 110/78  Pulse: 75  Temp: 98.6 F (37 C)  SpO2: 98%    BP Readings from Last 3 Encounters:  12/31/23 110/78  12/08/23 99/69  09/26/23 120/78   Wt Readings from Last 3 Encounters:  12/31/23 186 lb 12.8 oz (84.7 kg)  12/08/23 187 lb 9.6 oz (85.1 kg)  09/26/23 197 lb 9.6 oz (89.6 kg)    Physical Exam Constitutional:      General: She is not in acute distress.    Appearance: Normal appearance.  HENT:     Head: Normocephalic. Contusion (mild tenderness on palpation) present.   Cardiovascular:  Rate and Rhythm: Normal rate and regular rhythm.     Heart sounds: Normal heart sounds.  Pulmonary:     Effort: Pulmonary effort is normal.     Breath sounds: Normal breath sounds.  Skin:    General: Skin is warm and dry.  Neurological:     General: No focal deficit present.     Mental Status: She is alert.  Psychiatric:        Mood and Affect: Mood normal.        Behavior: Behavior normal.      Assessment/Plan: Please see individual problem list.  Type 2 diabetes mellitus without complication, without long-term current use of insulin (HCC) Assessment & Plan: Type 2 diabetes  mellitus with improved glycemic control. A1c reduced from 8.0% to 5.7% with Mounjaro  7.5 mg. POCT A1c today improved to 5.3. No hypoglycemia, excessive thirst, or urination. Increase Mounjaro  to 10 mg weekly. Encourage consistent water intake, at least 60 ounces daily. Continue healthy diet and regular exercise. We will continue to monitor.   Orders: -     Tirzepatide ; Inject 10 mg into the skin once a week.  Dispense: 6 mL; Refill: 0 -     POCT glycosylated hemoglobin (Hb A1C)  Contusion of scalp, initial encounter Assessment & Plan: Minor head injury with localized scalp contusion from foul ball hit 3-4 days ago. No loss of consciousness, nausea, vomiting or headaches. Mild concussion suspected with improvement noted. Continue icing the affected area. Use ibuprofen for pain management. Monitor for red flag symptoms such as nausea, vomiting, severe headaches, slurred speech, or vision changes.    Morbid obesity with BMI of 40.0-44.9, adult [facility]) Assessment & Plan: Obesity with weight loss plateau. Current weight 186 lbs, down 17 lbs since starting Mounjaro . Good adherence to diet and exercise app during weekdays. Increase Mounjaro  to 10 mg weekly to aid further weight loss. Encourage continued use of dietary app for monitoring intake. Continue healthy diet and regular exercise.    BMI 36.0-36.9,adult      Return in about 3 months (around 04/01/2024) for Follow up.   Leron Glance, NP-C [facility]

## 2023-12-31 NOTE — Assessment & Plan Note (Signed)
 Minor head injury with localized scalp contusion from foul ball hit 3-4 days ago. No loss of consciousness, nausea, vomiting or headaches. Mild concussion suspected with improvement noted. Continue icing the affected area. Use ibuprofen for pain management. Monitor for red flag symptoms such as nausea, vomiting, severe headaches, slurred speech, or vision changes.

## 2023-12-31 NOTE — Assessment & Plan Note (Signed)
 Obesity with weight loss plateau. Current weight 186 lbs, down 17 lbs since starting Mounjaro . Good adherence to diet and exercise app during weekdays. Increase Mounjaro  to 10 mg weekly to aid further weight loss. Encourage continued use of dietary app for monitoring intake. Continue healthy diet and regular exercise.

## 2023-12-31 NOTE — Assessment & Plan Note (Signed)
 Type 2 diabetes mellitus with improved glycemic control. A1c reduced from 8.0% to 5.7% with Mounjaro  7.5 mg. POCT A1c today improved to 5.3. No hypoglycemia, excessive thirst, or urination. Increase Mounjaro  to 10 mg weekly. Encourage consistent water intake, at least 60 ounces daily. Continue healthy diet and regular exercise. We will continue to monitor.

## 2024-03-26 ENCOUNTER — Other Ambulatory Visit (HOSPITAL_COMMUNITY)
Admission: RE | Admit: 2024-03-26 | Discharge: 2024-03-26 | Disposition: A | Discharge status: Home / Self Care | Source: Ambulatory Visit | Attending: Nurse Practitioner | Admitting: Nurse Practitioner

## 2024-03-26 ENCOUNTER — Ambulatory Visit: Payer: BC Managed Care – PPO | Admitting: Nurse Practitioner

## 2024-03-26 ENCOUNTER — Encounter: Payer: Self-pay | Admitting: Nurse Practitioner

## 2024-03-26 VITALS — BP 108/76 | HR 74 | Temp 98.3°F | Ht 60.0 in | Wt 181.8 lb

## 2024-03-26 DIAGNOSIS — N920 Excessive and frequent menstruation with regular cycle: Secondary | ICD-10-CM

## 2024-03-26 DIAGNOSIS — Z1329 Encounter for screening for other suspected endocrine disorder: Secondary | ICD-10-CM

## 2024-03-26 DIAGNOSIS — Z01419 Encounter for gynecological examination (general) (routine) without abnormal findings: Secondary | ICD-10-CM

## 2024-03-26 DIAGNOSIS — E66812 Obesity, class 2: Secondary | ICD-10-CM

## 2024-03-26 DIAGNOSIS — Z7985 Long-term (current) use of injectable non-insulin antidiabetic drugs: Secondary | ICD-10-CM

## 2024-03-26 DIAGNOSIS — D509 Iron deficiency anemia, unspecified: Secondary | ICD-10-CM

## 2024-03-26 DIAGNOSIS — Z6835 Body mass index (BMI) 35.0-35.9, adult: Secondary | ICD-10-CM

## 2024-03-26 DIAGNOSIS — E559 Vitamin D deficiency, unspecified: Secondary | ICD-10-CM | POA: Diagnosis not present

## 2024-03-26 DIAGNOSIS — Z124 Encounter for screening for malignant neoplasm of cervix: Secondary | ICD-10-CM

## 2024-03-26 DIAGNOSIS — Z13220 Encounter for screening for lipoid disorders: Secondary | ICD-10-CM | POA: Diagnosis not present

## 2024-03-26 DIAGNOSIS — E119 Type 2 diabetes mellitus without complications: Secondary | ICD-10-CM

## 2024-03-26 LAB — COMPREHENSIVE METABOLIC PANEL WITH GFR
ALT: 13 U/L (ref 3–35)
AST: 16 U/L (ref 5–37)
Albumin: 4.1 g/dL (ref 3.5–5.2)
Alkaline Phosphatase: 67 U/L (ref 39–117)
BUN: 13 mg/dL (ref 6–23)
CO2: 29 meq/L (ref 19–32)
Calcium: 9.3 mg/dL (ref 8.4–10.5)
Chloride: 103 meq/L (ref 96–112)
Creatinine, Ser: 0.58 mg/dL (ref 0.40–1.20)
GFR: 106.91 mL/min
Glucose, Bld: 91 mg/dL (ref 70–99)
Potassium: 3.8 meq/L (ref 3.5–5.1)
Sodium: 140 meq/L (ref 135–145)
Total Bilirubin: 0.4 mg/dL (ref 0.2–1.2)
Total Protein: 7.5 g/dL (ref 6.0–8.3)

## 2024-03-26 LAB — LIPID PANEL
Cholesterol: 171 mg/dL (ref 28–200)
HDL: 60.5 mg/dL
LDL Cholesterol: 92 mg/dL (ref 10–99)
NonHDL: 110.76
Total CHOL/HDL Ratio: 3
Triglycerides: 95 mg/dL (ref 10.0–149.0)
VLDL: 19 mg/dL (ref 0.0–40.0)

## 2024-03-26 LAB — HEMOGLOBIN A1C: Hgb A1c MFr Bld: 5.4 % (ref 4.6–6.5)

## 2024-03-26 LAB — CBC WITH DIFFERENTIAL/PLATELET
Basophils Absolute: 0.1 K/uL (ref 0.0–0.1)
Basophils Relative: 1.6 % (ref 0.0–3.0)
Eosinophils Absolute: 0.2 K/uL (ref 0.0–0.7)
Eosinophils Relative: 1.8 % (ref 0.0–5.0)
HCT: 39.7 % (ref 36.0–46.0)
Hemoglobin: 13.2 g/dL (ref 12.0–15.0)
Lymphocytes Relative: 24.7 % (ref 12.0–46.0)
Lymphs Abs: 2.1 K/uL (ref 0.7–4.0)
MCHC: 33.3 g/dL (ref 30.0–36.0)
MCV: 85.7 fl (ref 78.0–100.0)
Monocytes Absolute: 0.4 K/uL (ref 0.1–1.0)
Monocytes Relative: 4.5 % (ref 3.0–12.0)
Neutro Abs: 5.7 K/uL (ref 1.4–7.7)
Neutrophils Relative %: 67.4 % (ref 43.0–77.0)
Platelets: 458 K/uL — ABNORMAL HIGH (ref 150.0–400.0)
RBC: 4.64 Mil/uL (ref 3.87–5.11)
RDW: 14.2 % (ref 11.5–15.5)
WBC: 8.5 K/uL (ref 4.0–10.5)

## 2024-03-26 LAB — VITAMIN D 25 HYDROXY (VIT D DEFICIENCY, FRACTURES): VITD: 13.14 ng/mL — ABNORMAL LOW (ref 30.00–100.00)

## 2024-03-26 LAB — TSH: TSH: 1.63 u[IU]/mL (ref 0.35–5.50)

## 2024-03-26 MED ORDER — TIRZEPATIDE 12.5 MG/0.5ML ~~LOC~~ SOAJ: 12.5000 mg | SUBCUTANEOUS | 1 refills | Status: AC

## 2024-03-26 NOTE — Progress Notes (Signed)
 " Leron Glance, NP-C Phone: [phone]  Kristi Martinez is a 49 y.o. female who presents today for annual exam.   Discussed the use of AI scribe software for clinical note transcription with the patient, who gave verbal consent to proceed.  History of Present Illness   Kristi Martinez is a 49 year old female who presents for an annual physical exam and repeat Pap smear due to a previous abnormal HPV result.  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  She has a history of heavy menstrual bleeding, which has improved significantly. Previously, bleeding lasted for 10 to 15 days, but now it lasts only 3 to 4 days. She was prescribed medication for the bleeding but rarely needs it now. Her periods are irregular.  She is currently on Mounjaro  and has lost almost 40 pounds. She exercises at [employer] 3 to 4 times a week and has started counting calories, although she feels she might not be eating enough. She ensures she gets enough protein and mostly eats at home due to her father's condition.  No chest pain, shortness of breath, abdominal pain, constipation, diarrhea, urinary symptoms, headaches, dizziness, swallowing difficulties, skin changes, leg swelling, and significant mood issues. She reports improved sleep.      Tobacco Use History[1]  Medications Ordered Prior to Encounter[2]   ROS see history of present illness  Objective  Physical Exam Vitals:   03/26/24 0854  BP: 108/76  Pulse: 74  Temp: 98.3 F (36.8 C)  SpO2: 97%    BP Readings from Last 3 Encounters:  03/26/24 108/76  12/31/23 110/78  12/08/23 99/69   Wt Readings from Last 3 Encounters:  03/26/24 181 lb 12.8 oz (82.5 kg)  12/31/23 186 lb 12.8 oz (84.7 kg)  12/08/23 187 lb 9.6 oz (85.1 kg)    Physical Exam Exam conducted with a chaperone present Claryce Ellen, CMA).  Constitutional:      General: She is not in acute  distress.    Appearance: Normal appearance.  HENT:     Head: Normocephalic.     Right Ear: Tympanic membrane normal.     Left Ear: Tympanic membrane normal.     Nose: Nose normal.     Mouth/Throat:     Mouth: Mucous membranes are moist.     Pharynx: Oropharynx is clear.  Eyes:     Conjunctiva/sclera: Conjunctivae normal.     Pupils: Pupils are equal, round, and reactive to light.  Neck:     Thyroid: No thyromegaly.  Cardiovascular:     Rate and Rhythm: Normal rate and regular rhythm.     Heart sounds: Normal heart sounds.  Pulmonary:     Effort: Pulmonary effort is normal.     Breath sounds: Normal breath sounds.  Abdominal:     General: Abdomen is flat. Bowel sounds are normal.     Palpations: Abdomen is soft. There is no mass.     Tenderness: There is no abdominal tenderness.  Genitourinary:    General: Normal vulva.     Labia:        Right: No tenderness or lesion.        Left: No tenderness or lesion.      Vagina: Normal. No vaginal discharge or bleeding.     Cervix: Normal. No discharge, lesion or erythema.     Uterus: Normal.   Musculoskeletal:        General:  Normal range of motion.  Lymphadenopathy:     Cervical: No cervical adenopathy.  Skin:    General: Skin is warm and dry.     Findings: No rash.  Neurological:     General: No focal deficit present.     Mental Status: She is alert.  Psychiatric:        Mood and Affect: Mood normal.        Behavior: Behavior normal.      Assessment/Plan: Please see individual problem list.  Well woman exam with routine gynecological exam Assessment & Plan: Physical exam complete. We will check lab work as outlined. Her previous Pap was normal, but HPV was positive. A repeat Pap smear was performed today. A mammogram is scheduled for February. Cologuard screening is up to date. Declines flu and additional COVID vaccines. Tetanus vaccine is up to date. Continue routine dental and eye exams. Encourage healthy diet and  regular exercise. Return to care in 6 months, sooner as needed.    Type 2 diabetes mellitus without complication, without long-term current use of insulin (HCC) Assessment & Plan: Her diabetes is well managed with Mounjaro , which has been increased to 12.5 mg weekly. Last A1c- 5.3. She has experienced significant weight loss and maintains regular exercise and calorie counting. Continue the current regimen. Check A1c today.   Orders: -     Hemoglobin A1c -     Tirzepatide ; Inject 12.5 mg into the skin once a week.  Dispense: 6 mL; Refill: 1  Menorrhagia with regular cycle Assessment & Plan: Previously heavy bleeding has improved, with minimal bleeding now. No medication is needed as symptoms have resolved. Continue to monitor the menstrual cycle for any changes or return of symptoms. Check lab work as outlined.   Orders: -     CBC with Differential/Platelet  Iron  deficiency anemia, unspecified iron  deficiency anemia type Assessment & Plan: Anemia has improved following iron  infusions, with significant symptom relief. We will continue to monitor. Suspect improvement with control of heavy menstrual bleeding. Follow up with Hematology as scheduled.   Orders: -     CBC with Differential/Platelet  Class 2 severe obesity due to excess calories with serious comorbidity and body mass index (BMI) of 35.0 to 35.9 in adult Assessment & Plan: She has lost almost 40 pounds with Mounjaro , regular exercise and calorie counting. Continue the current exercise regimen and calorie counting, and monitor caloric intake to ensure adequate nutrition. We will continue to monitor.    Vitamin D  deficiency -     VITAMIN D  25 Hydroxy (Vit-D Deficiency, Fractures)  Thyroid disorder screen -     TSH  Lipid screening -     Comprehensive metabolic panel with GFR -     Lipid panel  Screening for cervical cancer -     Cytology - PAP     Return in about 6 months (around 09/23/2024) for Follow up.   Leron Glance, NP-C [facility] [place]     [1]  Social History Tobacco Use  Smoking Status Never  Smokeless Tobacco Never  [2]  Current Outpatient Medications on File Prior to Visit  Medication Sig Dispense Refill   cetirizine (ZYRTEC) 10 MG tablet Take 10 mg by mouth daily.     Iron , Ferrous Sulfate , 325 (65 Fe) MG TABS Take 1 tablet by mouth daily. 90 tablet 1   medroxyPROGESTERone  (PROVERA ) 10 MG tablet Take 1 tablet (10 mg total) by mouth daily. 10 tablet 5  Vitamin D , Ergocalciferol , (DRISDOL ) 1.25 MG (50000 UNIT) CAPS capsule Take 1 capsule (50,000 Units total) by mouth every 7 (seven) days. 13 capsule 1   No current facility-administered medications on file prior to visit.   "

## 2024-03-31 LAB — CYTOLOGY - PAP
Chlamydia: NEGATIVE
Comment: NEGATIVE
Comment: NEGATIVE
Comment: NEGATIVE
Comment: NORMAL
Diagnosis: NEGATIVE
Diagnosis: REACTIVE
High risk HPV: NEGATIVE
Neisseria Gonorrhea: NEGATIVE
Trichomonas: NEGATIVE

## 2024-04-02 ENCOUNTER — Ambulatory Visit: Payer: Self-pay | Admitting: Nurse Practitioner

## 2024-04-06 ENCOUNTER — Encounter: Payer: Self-pay | Admitting: Nurse Practitioner

## 2024-04-06 DIAGNOSIS — Z6835 Body mass index (BMI) 35.0-35.9, adult: Secondary | ICD-10-CM | POA: Insufficient documentation

## 2024-04-06 NOTE — Assessment & Plan Note (Signed)
 She has lost almost 40 pounds with Mounjaro , regular exercise and calorie counting. Continue the current exercise regimen and calorie counting, and monitor caloric intake to ensure adequate nutrition. We will continue to monitor.

## 2024-04-06 NOTE — Assessment & Plan Note (Signed)
 Previously heavy bleeding has improved, with minimal bleeding now. No medication is needed as symptoms have resolved. Continue to monitor the menstrual cycle for any changes or return of symptoms. Check lab work as outlined.

## 2024-04-06 NOTE — Assessment & Plan Note (Signed)
 Physical exam complete. We will check lab work as outlined. Her previous Pap was normal, but HPV was positive. A repeat Pap smear was performed today. A mammogram is scheduled for February. Cologuard screening is up to date. Declines flu and additional COVID vaccines. Tetanus vaccine is up to date. Continue routine dental and eye exams. Encourage healthy diet and regular exercise. Return to care in 6 months, sooner as needed.

## 2024-04-06 NOTE — Assessment & Plan Note (Signed)
 Anemia has improved following iron  infusions, with significant symptom relief. We will continue to monitor. Suspect improvement with control of heavy menstrual bleeding. Follow up with Hematology as scheduled.

## 2024-04-09 ENCOUNTER — Inpatient Hospital Stay

## 2024-08-06 ENCOUNTER — Other Ambulatory Visit

## 2024-08-06 ENCOUNTER — Ambulatory Visit: Admitting: Oncology

## 2024-09-23 ENCOUNTER — Ambulatory Visit: Admitting: Nurse Practitioner
